# Patient Record
Sex: Female | Born: 1960 | Race: Black or African American | Hispanic: No | Marital: Single | Smoking: Never smoker
Health system: Southern US, Community
[De-identification: ages and names within clinical notes are randomized; demographics above are authoritative.]

## PROBLEM LIST (undated history)

## (undated) DIAGNOSIS — E119 Type 2 diabetes mellitus without complications: Secondary | ICD-10-CM

## (undated) DIAGNOSIS — I82409 Acute embolism and thrombosis of unspecified deep veins of unspecified lower extremity: Secondary | ICD-10-CM

## (undated) DIAGNOSIS — R413 Other amnesia: Secondary | ICD-10-CM

## (undated) DIAGNOSIS — G629 Polyneuropathy, unspecified: Secondary | ICD-10-CM

## (undated) DIAGNOSIS — E7800 Pure hypercholesterolemia, unspecified: Secondary | ICD-10-CM

## (undated) DIAGNOSIS — I10 Essential (primary) hypertension: Secondary | ICD-10-CM

## (undated) DIAGNOSIS — N289 Disorder of kidney and ureter, unspecified: Secondary | ICD-10-CM

## (undated) HISTORY — PX: TOE AMPUTATION: SHX809

## (undated) HISTORY — PX: LEG AMPUTATION: SHX1105

---

## 2010-05-05 DIAGNOSIS — E119 Type 2 diabetes mellitus without complications: Secondary | ICD-10-CM

## 2020-05-10 DIAGNOSIS — D649 Anemia, unspecified: Secondary | ICD-10-CM | POA: Diagnosis present

## 2020-05-10 DIAGNOSIS — K922 Gastrointestinal hemorrhage, unspecified: Secondary | ICD-10-CM | POA: Diagnosis present

## 2020-05-15 NOTE — Consults (Signed)
 Inpatient Consult to [facility] Consult performed by: Lyell Catena, MD Consult ordered by: Sporici, Romeo Augustin, MD      Patient: Brandi Hernandez DOB: [birth year] MRN: [id number]   Reason for Consult:  R foot infection  HPI:  I would like to thank you for allowing me to participate in the care of this 60 year old female who has not received any COVID-19 vaccination with past medical history of type 1 diabetes mellitus with diabetic peripheral neuropathy, GI bleed, chronic disease and blood loss anemia, hypertension, obesity, peripheral vascular disease, status post right lower extremity angiogram and angioplasty in the past, most recently left revascularization fem pop artery with angioplasty/stent on 02/20/2020, 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 lugdunensis and prevotella, treated with IV antibiotic in the hospital discharged off antibiotics.  This time patient presented to the emergency room on 02/19 with reports of weakness, lethargy and possibly wound infection but the left TMA site.  Upon arrival she was afebrile with a temperature of 99.8, subsequently spiked a fever on 2/22 but has been afebrile since, she has been hemodynamically stable with episodic tachycardia and and controlled hypertension and on room air.  Labs revealed glucose of 314, sodium 135, BUN was 37 with a creatinine of 2.08 down to 0.6 yesterday, LFTs were normal, lactic acid was 1.7, WBC  count was 21.35 down to 16.67, hemoglobin was 4.7, patient required transfusions and now it is up to 8.5, platelets were 467 K, urinalysis was benign, drug screen was negative, procalcitonin was 0.94, COVID-19 screen was negative on 02/19, urine culture grew less than a 1000 colonies of lactobacillus, chest x-ray on admission with no infiltrate, CT of the abdomen and pelvis on 02/19 showed no obvious source of bleeding, patient underwent EGD on 02/21 with no source of bleeding identified, colonoscopy on 02/22 showing diverticulosis with no obvious source of bleeding, polypectomies were performed with pathology showing fragments of tubular adenomas.  Due to extensive nature of infected gangrene patient underwent a guillotine left through the ankle amputation on 02/23, Gram stain showed moderate Gram-negative bacilli and Gram-positive cocci in pairs, culture pending, patient received 4 days of IV Vanco, last dose on 02/22 and cefepime, currently day # 6.  ID is consulted to comment on management.  At this time patient seems to be depressed about her health condition, reports no significant pain the amputation site and denies recurrent black stools.  She is tolerating her diet and has had no fever or chills.  Patient claims that the wound has never healed since the procedure in December, she has been following with Podiatry and has been on 2 different antibiotics prior to admission but could not remember the names.  ROS:  Constitutional:  + Intermittent fever, no chills, on and off night sweats, generalized weakness  HEENT:  No visual complaints, no dental complaints, no hearing loss, no nasal congestion, no sore throat. Respiratory:  No shortness of breath or cough, no sputum production, no pleuritic chest pain, no wheezing Cardiovascular:  No chest pain, no orthopnea, no dyspnea on exertion, no palpitations or bradycardia, no leg swelling Gastrointestinal:  Denies any nausea, vomiting, abdominal pain or  diarrhea, admits to dark colored stools prior to admission, see  above regarding workup  Genitourinary:   no dysuria, hematuria or frequency Hematology/lymphatics:  + bruising/bleeding tendency due to blood thinners, no swollen lymph nodes Endocrine:  No polydipsia, no polyuria, no heat or cold intolerance Immunology:  Not immunocompromised, +recurrent diabetic/ischemic foot infections Musculoskeletal:  See above Skin:  See above Psychiatric:  Somewhat depressed Neurologic:  Alert and oriented, reports no headaches, abnormal balance, numbness or tingling in any of the extremities  PMH/PSH:  Past Medical History:  Diagnosis Date  . Diabetes mellitus type 1 (CMS-HCC)   . DJD (degenerative joint disease) 05/05/2010  . DM (diabetes mellitus) (CMS-HCC) 05/05/2010  . Hypertension   . Obesity 05/05/2010  . Peripheral vascular disease (CMS-HCC)     Past Surgical History:  Procedure Laterality Date  . AMPUTATION FOOT,TRANSMETATARSAL Right 06/09/2018   Procedure: AMPUTATION FOOT TRANSMETARSAL;  Surgeon: Devora Ards, DPM;  Location: [facility] OR;  Service: PODIATRY  . AMPUTATION FOOT,TRANSMETATARSAL Left 03/06/2020   Procedure: AMPUTATION FOOT TRANSMETARSAL;  Surgeon: Delayne Reche HERO., DPM;  Location: [facility] OR;  Service: PODIATRY  . AMPUTATION METATARSAL+TOE,SINGLE Left 02/29/2020   Procedure: AMPUTATION METATARSAL W/ TOE SINGLE;  Surgeon: Devora Ards, DPM;  Location: [facility] OR;  Service: PODIATRY  . DEEP DISSEC FOOT INFEC,1 BURSA Left 02/29/2020   Procedure: INCISION AND DRAINAGE BELOW FASCIA FOOT 1 BURSAL SPACE;  Surgeon: Devora Ards, DPM;  Location: [facility] OR;  Service: PODIATRY  . DEEP DISSEC FOOT INFEC,MULTIPLE Right 06/05/2018   Procedure: INCISION AND DRAINAGE BELOW FASCIA FOOT MULTIPLE AREAS;  Surgeon: Devora Ards, DPM;  Location: [facility] OR;  Service: PODIATRY  . DEEP INCIS FOOT BONE INFECTN Right 06/14/2018   Procedure: INCISION BONE CORTEX FOOT;  Surgeon: Devora Ards, DPM;   Location: [facility] OR;  Service: PODIATRY  . ESOPHAGOGASTRODUODENOSCOPY TRANSORAL DIAGNOSTIC N/A 05/12/2020   Procedure: EGD FLEXIBLE TRANSORAL DIAGNOSTIC W/WO SPECIMEN COLLECTION BY BRUSHING/WASHING;  Surgeon: Geralene Lea HERO., DO;  Location: [facility] OR;  Service: GI  . LAPAROSCOPIC CHOLECYSTECTOMY N/A 12/31/2019   Procedure: LAPAROSCOPY CHOLECYSTECTOMY;  Surgeon: Reginal Sharlet DASEN, MD;  Location: [facility] OR;  Service: GENERAL  . PR COLONOSCOPY W/BIOPSY SINGLE/MULTIPLE N/A 11/19/2019   Procedure: COLONOSCOPY FLEXIBLE W/ BIOPSY;  Surgeon: Reginal Sharlet DASEN, MD;  Location: [facility] OR;  Service: GENERAL  . PR COLSC FLX W/RMVL OF TUMOR POLYP LESION SNARE TQ N/A 05/13/2020   Procedure: COLONOSCOPY FLEXIBLE W/ REMOVAL LESION BY SNARE TECHNIQUE;  Surgeon: Geralene Lea HERO., DO;  Location: [facility] OR;  Service: GI  . PR DEBRIDEMENT, SKIN, SUB-Q TISSUE,MUSCLE,=<20 SQ CM Left 03/04/2020   Procedure: DEBRIDEMENT MUSCLE AND FASCIA 20 SQ CM/<;  Surgeon: Delayne Reche HERO., DPM;  Location: [facility] OR;  Service: PODIATRY  . PR NEGATIVE PRESSURE WOUND THERAPY DME </= 50 SQ CM Left 03/04/2020   Procedure: NEGATIVE PRESSURE WOUND THERAPY </= 50 SQ CM;  Surgeon: Delayne Reche HERO., DPM;  Location: [facility] OR;  Service: PODIATRY  . PR REVASCULARIZE FEM/POP ARTERY,ANGIOPLASTY N/A 02/20/2020   Procedure: VASC REVASCULARIZATION FEM/POPLITEAL ARTERY W/ ANGIOPLASTY;  Surgeon: Dyane Toribio PARAS, MD;  Location: [facility] OR;  Service: VASCSURG  . PR REVASCULARIZE FEM/POP ARTERY,ANGIOPLASTY/STENT N/A 02/20/2020   Procedure: VASC REVASCULARIZATION FEM/POPLITEAL ARTERY W/ STENT AND ANGIOPLASTY;  Surgeon: Dyane Toribio PARAS, MD;  Location: [facility] OR;  Service: VASCSURG  . PR REVASCULARIZE FEM/POP ARTERY,ANGIOPLASTY/STENT/ATHERECTOMY N/A 02/20/2020   Procedure: VASC REVASCULARIZATION FEM/POPLITEAL ARTERY W/ STENT, ATHERECTOMY, AND ANGIOPLASTY;  Surgeon: Dyane Toribio PARAS, MD;  Location: [facility] OR;  Service: VASCSURG  . PR REVASCULARIZE TIBIAL/PERON ARTERY,ANGIOPLASTY/ATHERECTOMY  INITIAL  06/08/2018   Procedure:  CATH REVASCULARIZATION TIB/PERONEAL ARTERY W/ ATHERECTOMY AND ANGIOPLASTY; 1ST VESSEL;  Surgeon: Dyane Toribio PARAS, MD;  Location: [facility] HEART VASCULAR INVASIVE;  Service: VASCSURG  . PR SLCTV CATHJ 2ND ORDER ABDL PEL/LXTR ART [facility] N/A 02/20/2020   Procedure: VASC SELECTIVE CATHETERIZATION 2ND ORDER ABDOMINAL PELVIC/LOWER EXTREMITY ART BRANCH;  Surgeon: Dyane Toribio PARAS, MD;  Location: [facility] OR;  Service: VASCSURG  . PR THROMBOLYSIS ARTERIAL INFUSION ICRA RS&I INIT TX N/A 06/08/2018   Procedure: VASC TRANSCATHETER THERAPY, ARTERIAL INFUSION FOR THROMBOLYSIS OTHER THAN CORONARY OR INTRACRANIAL, INITIAL TREATMENT DAY;  Surgeon: Dyane Toribio PARAS, MD;  Location: [facility] HEART VASCULAR INVASIVE;  Service: VASCSURG  . SECD CLOS SURG WND EXTEN/COMPLIC Right 06/14/2018   Procedure: SECONDARY CLOSURE SURG WOUND/DEHISCENCE EXTENSIVE/COMPLEX;  Surgeon: Devora Ards, DPM;  Location: [facility] OR;  Service: PODIATRY  . US  GUIDE, VASC ACCESS, REQ EVAL, POTENTIAL SITES, VESSEL PATENCY, NEEDLE ENTRY, W/RECORD & REPORT  02/20/2020   Procedure: VASC US  VASC ACCESS SITS VSL PATENCY NDL ENTRY;  Surgeon: Dyane Toribio PARAS, MD;  Location: [facility] OR;  Service: VASCSURG     MEDS:  . cefEPIME  2 g intraVENOUS Q12H  . chlorhexidine gluconate  1 application topical Daily  . insulin aspart  0-6 Units subcutaneous With meals & bedtime  . lansoprazole  30 mg oral Daily pre breakfast    Prior to Admission medications   Medication Sig Start Date End Date Taking? Authorizing Provider  amLODIPine  5 mg tablet Take 5 mg by mouth daily. Indications: High Blood Pressure Disorder   Yes Provider, Outside  aspirin  EC 81 MG EC tablet Take 1 tablet by mouth daily. 02/21/20  Yes Ciccarelli, Delon BROCKS., CRNP  atorvaSTATin  40 mg tablet Take 1 tablet by mouth daily at bedtime. 02/20/20  Yes Ciccarelli, Delon BROCKS., CRNP  clopidogrel 75 MG tablet Take 75 mg by mouth daily.   Yes Provider, Outside  Glucose Blood (ONE TOUCH ULTRA  TEST) In Vitro STRP Test daily Dx E11.9 06/23/15  Yes Leola Tanda LABOR, MD  Insulin NPH Isophane & Regular (NOVOLIN  70/30 FLEXPEN RELION) (70-30) 100 UNIT/ML Egan SUPN 20 units in prebreakfast and 15 units predinner Patient taking differently: Inject  under the skin 2 times a day with meals. Inject 20 units daily before breakfast and 15 units before dinner 06/16/18  Yes Vasiliadis, Maria, DO  Insulin Pen Needle (RELION MINI PEN NEEDLES) 31G X 6 MM XX MISC Twice daily 06/16/18  Yes Vasiliadis, Maria, DO  lansoprazole 30 mg DR capsule Take 1 capsule by mouth daily before breakfast. 02/21/20   Ciccarelli, Delon BROCKS., CRNP  lisinopril  40 MG tablet Take 40 mg by mouth daily. Indications: High Blood Pressure Disorder 06/09/18  Yes Provider, Outside  meloxicam 15 MG tablet Take 1 tablet by mouth daily as needed (pain). 11/07/19  Yes Provider, Outside  ONE TOUCH ULTRASOFT LANCETS XX MISC check blood glucose 1-2  times a day/ PRN 01/11/13  Yes Leola Tanda LABOR, MD  Santyl 250 UNIT/GM External Ointment Apply 1 application to affected area every other day. To left foot. 04/22/20  Yes Provider, Outside  sodium hypochlorite 1/2 strength 0.25 % SOLN topical solution Apply 1 application to affected area every other day. To left foot. 04/15/20  Yes Provider, Outside     ALLERGIES:  No Known Allergies   FH:  Family History  Problem Relation Name Age of Onset  . CAD Sister    . Hypertension Sister    . Diabetes Mother    . Prostate Cancer Father prostate  SH:  Social History   Tobacco Use  . Smoking status: Never Smoker  . Smokeless tobacco: Never Used  Substance Use Topics  . Alcohol use: Not Currently    Comment: none  . Drug use: No     Vitals:  Temp (24hrs), Avg:98.2 F (36.8 C), Min:97.4 F (36.3 C), Max:99.8 F (37.7 C)   BP 161/80  Pulse 80  Temp (Src) 97.9 F (36.6 C) (Oral)  Resp 18  Ht 5' 7 (1.702 m)  Wt 202 lb 6.4 oz (91.8 kg)  SpO2 99%  BMI 31.7 kg/m2  Breastfeeding? No    Physical Exam:  General:  Chronically ill-appearing somewhat obese female No acute distress HEENT: PERRL, EOMI, dentition is fair with no oral lesions Neck: Supple, trachea midline, no lymphadenopathy, no JVDs Lungs: Clear to auscultation bilaterally CV: Regular rate and rhythm, S1 and S2 normal, no murmur, rub, or gallop GI: Soft, nondistended, non-tender, bowel sounds normoactive no organomegaly or masses GU:  Normal appearing genitalia.  + foley catheter in place, inserted 2/19 Back:  No CVA or flank tenderness, no spine tenderness Extremities   bulky surgical dressing over the left TAA, well-healed right TMA scar, + peripheral IV present with no signs of infection Skin:  No rashes Psych:  Somewhat depressed but cooperative with intact cognition Neuro:  Alert and oriented x 4, no obvious focal motor deficits, cranial nerves 2-12 grossly intact   Media Information   Document Information  [employer] Clinical Image  Let dorsal foot pre amputation  05/11/2020 11:00  Attached To:  Hospital Encounter on 05/10/20 <redacted file path>  Source Information  Lenore Odor, RN  [facility]   Media Information   Document Information  [employer] Clinical Image  Left lateral foot   05/11/2020 11:00  Attached To:  Hospital Encounter on 05/10/20 <redacted file path>  Source Information  Lenore Odor, RN  [facility]    Labs: Ambulatory Lab values    Lab Details Latest Ref Rng & Units 05/14/2020 05/14/2020 05/14/2020   RBC 3.70 - 5.20 M/uL 3.15(L) 3.35(L) 3.19(L)   WBC 3.50 - 10.00 K/uL 16.28(H) 16.53(H) 16.67(H)   Hemoglobin 11.7 - 15.7 g/dL 1.5(O) 8.9(L) 8.5(L)   Hematocrit 34.0 - 47.0 % 25.5(L) 27.3(L) 25.7(L)   PLT 150 - 400 K/uL 388 405(H) 407(H)        Results from last 7 days  Lab Units 05/14/20 0537 05/13/20 0547 05/12/20 0530  SODIUM mmol/L 140 142 140  POTASSIUM mmol/L 3.6 3.6 4.0  CARBON DIOXIDE mmol/L  21 22 20*  UREA NITROGEN mg/dL 5* 6* 10  CREATININE mg/dL 9.39 9.38 9.30  GLUCOSE mg/dL 90 865* 850*  CALCIUM  mg/dL 7.7* 7.4* 7.4*    Results from last 7 days  Lab Units 05/12/20 0530 05/10/20 1236  ALKALINE PHOSPHATASE IU/L 76 87  ALBUMIN g/dL 2.2*  --   ALT IU/L 20 27  AST IU/L 22 46*    Procalcitonin result last 7 days      05/10/20                  1236          PCT 0.94               02/28/2020: LACTATE 1.8 05/10/2020: LACTATE 1.7  Urinalysis Reflex Culture  Results for orders placed or performed during the hospital encounter of 10/22/19  URINALYSIS REFLEX CULTURE   Specimen: URINE  Result Value Ref Range  SP. GRAVITY 1.012 1.003 - 1.035   GLUCOSE 3+ (A) NEGATIVE   KETONE NEGATIVE NEGATIVE   BLOOD NEGATIVE NEGATIVE   PH 5.0 5.0 - 8.0   PROTEIN 2+ (A) NEGATIVE   UROBILINOGEN <2.0 <2.0 mg/dL   NITRITE NEGATIVE NEGATIVE   LEUK ESTERASE 1+ (A) NEGATIVE   BACTERIA NEGATIVE NEGATIVE   SQUAMOUS EPITHELIAL CELLS RARE <3 /LPF   HYALINE CASTS >10 /LPF   RBC'S 0-2 0 - 3 /HPF   WBC'S 3-5 0 - 5 /HPF     Urine Culture: No results found for this or any previous visit.  Blood Cultures Results for orders placed or performed during the hospital encounter of 05/10/20  BLOOD CULTURE   Specimen: BLOOD  Result Value Ref Range   CULTURE      NO GROWTH NO ADDITIONAL NEGATIVE RESULTS WILL BE POSTED UNTIL THE FINAL REPORT     Radiology:  No results found.   Assessment and Plan:  Impression:  1. Infected gangrenous left TMA stump, procedure initially done on 03/06/2020 and at that time cultures grew Citrobacter, PSE/VSE, methicillin-resistant S. lugdunensis and prevotella, status post guillotine through ankle amputation on 02/23 with finding of gross purulence, or cultures pending but Gram stain is suggestive of polymicrobial infection, status post 4 days of IV Vanco, last dose on 02/22, currently on day # 6 IV cefepime. 2. History of significant peripheral  vascular disease, status post multiple procedures, most recently on 12/01 with angiogram, vascular revascularization fem/popliteal artery with stents, angioplasty and atherectomy. 3. Leukocytosis, multifactorial likely due to above as well as reactive to significant anemia, does not seem to be resolving possibly due to persistent source of infection in her foot, possibly uncovered pathogens such as anaerobes. 4. History of type 1 diabetes mellitus with diabetic peripheral neuropathy, poorly controlled 5. Significant anemia likely due to chronic disease but most recently blood loss anemia, status post EGD and colonoscopy on 2/21 and 2/22 respectively with no identifiable source of bleed, status post polypectomies with pathology showing tubular adenomas 6. History of hypertension, poorly controlled  7. Acute renal failure, present on admission, likely due to dehydration and sepsis, already resolved   Recommendations:  1. Discontinue IV cefepime 2. Start IV Zosyn pending OR cultures, clearly aggressive antibiotic management will be pursued to avoid BKA as per patient's request, discharge antibiotic route duration will depend on culture and wound healing.  I plan to follow the patient closely post discharge along with Podiatry. 3. Await OR cultures 4. Probiotics   Case discussed with primary care team ID will follow, thank you for this consult

## 2020-05-15 NOTE — Progress Notes (Signed)
 ------------------------------------------------------------------------------- Attestation signed by Loretta Hamilton, MD at 05/15/2020  4:33 PM Patient seen and examined.  I had a long discussion with the family and with the patient as to the plan.  We have performed a guillotine amputation as yesterday I discovered that the area of muscle destruction and gross purulence was greater than expected.  In these situations a two-stage operation has been shown to be better than single-stage amputation.  Additionally, I want to make sure that the muscle viability maintains itself over the next several days so that we can assure the patient that her below-knee amputation has the best chance of healing.  We will continue observation antibiotics and dressing changes. -------------------------------------------------------------------------------  Vascular Surgery Soap Note:  (LOS: 5 days)   Subjective     Objective     POD #1 Guillotine through ankle amputation   BP 148/71  Pulse 88  Temp (Src) 97.7 F (36.5 C) (Oral)  Resp 17  Ht 5' 7 (1.702 m)  Wt 202 lb 6.4 oz (91.8 kg)  SpO2 100%  BMI 31.7 kg/m2  Breastfeeding? No  Intake/Output Summary (Last 24 hours) at 05/15/2020 1114 Last data filed at 05/15/2020 0730 Gross per 24 hour  Intake 1290.44 ml  Output 1795 ml  Net -504.56 ml    Physical Exam HENT:     Mouth/Throat:     Mouth: Mucous membranes are moist.  Eyes:     Extraocular Movements: Extraocular movements intact.     Pupils: Pupils are equal, round, and reactive to light.  Cardiovascular:     Rate and Rhythm: Normal rate and regular rhythm.  Pulmonary:     Effort: Pulmonary effort is normal.     Breath sounds: Normal breath sounds. No wheezing, rhonchi or rales.  Musculoskeletal:     Cervical back: Normal range of motion and neck supple.  Skin:    Comments: Left ankle amputation with ACE  Neurological:     Comments: Oriented x 3, but some trouble understanding and/or  digesting complexity of hospitalization and plan of care.     Labs:  Results from last 7 days  Lab Units 05/15/20 0708  WBC K/uL 15.40*  HEMOGLOBIN g/dL 8.5*  HEMATOCRIT % 74.2*  PLATELETS K/uL 405*   Results from last 7 days  Lab Units 05/15/20 0708  SODIUM mmol/L 137  POTASSIUM mmol/L 4.1  CARBON DIOXIDE mmol/L 24  UREA NITROGEN mg/dL 17  CREATININE mg/dL 9.18  GLUCOSE mg/dL 656*  CALCIUM  mg/dL 7.3*   Results from last 7 days  Lab Units 05/10/20 1236  INR  1.49   Data Review:  Last Telemetry Reading  Cardiac Rhythm Cardiac Rhythm: sinus rhythm Telemetry/Cardiac Monitor QTC: 435   XR CHEST 1 VIEW  Result Date: 05/10/2020 CLINICAL INFORMATION: Sepsis. PROCEDURE: Single AP view of the chest. COMPARISON: 06/05/2018  FINDINGS: Support tubes, lines, devices, surgical material: None. Lungs and Pleura: Clear lungs. No pleural effusion. No pneumothorax. Cardiovascular and Mediastinum: The cardiac silhouette is within limits of normal. Mediastinal contour is within limits of normal. Skeleton, other: No acute osseous abnormalities.   No active disease in the chest.   CT ABDOMEN/PELVIS WO IV CONTRAST  Result Date: 05/10/2020 CT OF THE ABDOMEN AND PELVIS WITHOUT CONTRAST CLINICAL INFORMATION: Anemia, melena PROCEDURE: CT examination of the abdomen and pelvis was performed without intravenous contrast. The lack of intravenous contrast limits the evaluation of the solid organs. Enteric contrast was not administered. This limits the ability to identify a possible gastrointestinal tract mass which might be  responsible for melena and anemia. COMPARISON: None FINDINGS: LOWER THORAX: The lung bases are clear. The heart is normal in size. The blood pool is hypodense as compared to the myocardium, which is consistent with anemia. LIVER: The right lobe measures more than 23 cm craniocaudal due to the presence of a Riedel's lobe, a normal variant. Normal parenchymal density. No suggestion of  focal masses in this limited noncontrast study.      BILE DUCTS: No intrahepatic or extrahepatic bile duct dilation. GALLBLADDER: Cholecystectomy. PANCREAS: Within limits of an unenhanced exam, no focal pancreatic mass or calcification is identified. SPLEEN: Normal size. No suggestion of focal lesion. ADRENAL GLANDS: Within normal limits. KIDNEYS/URETERS: No hydroureteronephrosis. No radiopaque urinary tract calculi. No cysts or contour deforming masses. No perinephric stranding. BLADDER: Decompressed by a Foley catheter. The catheter tip extends into the orifice of the right ureter at the UVJ resulting in focal dilatation and wall thickening of the distal ureter (series 5/image 56). REPRODUCTIVE ORGANS: There is a slightly exophytic, 3.4 cm subserosal fibroid in the left uterine fundus. No adnexal masses seen. 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 No bowel dilatation. No adjacent inflammatory change. Mild constipation is noted in the distal colon. PERITONEUM/RETROPERITONEUM: No fluid collection, ascites, or pneumoperitoneum. LYMPH NODES: No abdominal or pelvic lymphadenopathy, by imaging size criteria. VESSELS: Limited assessment without contrast. No abdominal aortic aneurysm [[id number]]. ABDOMINAL/PELVIC WALL: Within limits of normal. BONES: No aggressive osseous lesion.   1. Limited study without oral or intravenous contrast. No acute findings. 2. Hypodense blood pool consistent with anemia. 3. Partial obstruction of the right UVJ by the tip of the Foley catheter. This may be transient as the bladder is decompressed. 4.Incidental gastric diverticulum and a subserosal uterine fibroid.    Assessment & Plan  Principal Problem:   Sepsis (CMS-HCC) Active Problems:   DM (insulin)    AKI (acute kidney injury) (CMS-HCC)   Peripheral vascular disease (CMS-HCC)   HTN (hypertension)   GI bleed   Anemia   Melena   Non-healing wound of lower extremity   Assessment/  Plan:   Gangrene left lower extremity - POD # 1 guillotine through ankle amputation  - gross purulence in the entirety of the forefoot with non viability of musculature extending approximately 6 cm into the anterior compartment.  Purulent matter extending 4 cm and anterior compartment.  Clean non grossly contaminated musculature in the posterior compartment and the remainder of the anterior and lateral compartments after debridement.  - continue IV antibiotics in order to attempt to clear infection  - will revise to either an above the knee or below the knee amputation next week.  - appreciate ID input - family to be updated again by Dr. Loretta today      Lauren D. Waronker, NP

## 2020-05-23 NOTE — H&P (Signed)
 Burgess Divine, MD  05/23/2020  9:24 PM   PCP:     Comer [name]  Extended Emergency Contact Information Primary Emergency Contact: Christin, [place] Phone: [phone] Relation: Niece/Nephew Secondary Emergency Contact: Simpson,Carol Address: [address] [place], [place] [address] Mobile Phone: [phone] Relation: Sibling  Chief Complaint:    Elevated blood pressure    ==============================================================  Assessment/Plan:  Principal Problem:   HTN (hypertension) Active Problems:   DM (insulin)    Acute osteomyelitis of left ankle or foot (CMS-HCC)   Anemia   Tachycardia   S/P BKA (below knee amputation) unilateral, left (CMS-HCC)  Hypertensive urgency  - patient asymptomatic, BP 183/90 in ER -reviewed previous hospitalization, blood pressure running 150-170 systolic -increase Norvasc  to 10 mg daily, continue lisinopril  40 mg -p.r.n. 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  Diabetes mellitus type 2 with neuropathy - continue NovoLog 4 units t.i.d. with meals - continue Lantus 12 units at bedtime - sliding scale insulin  Peripheral arterial disease status post multiple revascularization - continue statin, aspirin    Chronic anemia/hx lower GI bleed - hemoglobin was 8.2 on discharge -patient underwent colonoscopy on previous hospitalization and required 5 units PRBC   CODE STATUS:   Full code VTE prophylaxis:   SubQ Lovenox Disposition:  Likely patient can be discharged to [facility] once BP better controlled  Problems and plans were discussed and explained.  These persons indicate understanding of POC and are in agreement.   [x]  patient    [x]  nurses []  family   []  clinical pharmacist      []  consultants  ==============================================================  History of Present  Illness:  Brandi Hernandez is a 60 y.o. 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  The EMT crew called ER as patient found to have blood pressure 190/88 and elevated heart rate and wanted to return patient to the room.  [facility] was involved and plan was to hospitalize the patient via ER. Patient denies any chest pain, shortness of breath, headache, new weakness numbness, extremity pain.  Reviewed labs from 03/04 and patient had COVID 19 negative PCR.  History obtained from:  [x]  patient   []  outpatient clinicians  []  family   []  staff at facility (SNF, [place]) []  other     [x]  review of medical record as summarized below  Pt unable to provide reliable history because: []  Acute mental status change []  Acute illness/medical condition []  chronic dementia/mental challenges []  poor insight []  other  ____   ED Medications:  No orders of the defined types were placed in this encounter.    Review of Systems:  Review of Systems  Constitutional: Negative for chills, fever, malaise/fatigue and weight loss.  HENT: Negative for hearing loss and tinnitus.   Eyes: Negative for blurred vision, double vision and photophobia.  Respiratory: Negative for cough, hemoptysis, sputum production and shortness of breath.   Cardiovascular: Negative for chest pain, palpitations and orthopnea.  Gastrointestinal: Negative for abdominal pain, heartburn, nausea and vomiting.  Genitourinary: Negative  for dysuria, frequency and urgency.  Skin: Negative for rash.  Neurological: Negative for dizziness, tingling, tremors, sensory change, speech change, focal weakness and headaches.   Psychiatric/Behavioral: Negative for substance abuse and suicidal ideas.   Reviewed and inquired about the all system directly related to the patient's problem along with reviewing all other systems which are Non Contributory other than noted  Allergies:    Allergy List: No Known Allergies.  Home Medications:  Prior to Admission medications   Medication Sig Start Date End Date Taking? Authorizing Provider  amLODIPine  5 mg tablet Take 5 mg by mouth daily. Indications: High Blood Pressure Disorder    Provider, Outside  aspirin  EC 81 MG EC tablet Take 1 tablet by mouth daily. 02/21/20   Ciccarelli, Delon BROCKS., CRNP  atorvaSTATin  40 mg tablet Take 1 tablet by mouth daily at bedtime. 02/20/20   Ciccarelli, Delon C., CRNP  Glucose Blood (ONE TOUCH ULTRA TEST) In Vitro STRP Test daily Dx E11.9 06/23/15   Leola Tanda LABOR, MD  insulin aspart 100 UNIT/ML PEN injection Inject 4 Units under the skin 3 times a day with meals. 05/23/20   Sporici, Romeo Augustin, MD  insulin glargine 100 UNIT/ML PEN injection Inject 12 Units under the skin daily at bedtime. 05/23/20   Sporici, Romeo Augustin, MD  lansoprazole 30 mg DR capsule Take 1 capsule by mouth daily before breakfast. 02/21/20   Ciccarelli, Delon BROCKS., CRNP  lisinopril  40 MG tablet Take 40 mg by mouth daily. Indications: High Blood Pressure Disorder 06/09/18   Provider, Outside  meloxicam 15 MG tablet Take 1 tablet by mouth daily as needed (pain). 11/07/19   Provider, Outside  oxyCODONE-acetaminophen  5-325 MG per tablet Take 1 tablet by mouth every 6 hours as needed (If pain not relieved by acetaminophen ). 05/23/20   Sporici, Romeo Augustin, MD  Santyl 250 UNIT/GM External Ointment Apply 1 application to affected area every other day. To left foot. 04/22/20   Provider, Outside  sodium hypochlorite 1/2 strength 0.25 % SOLN topical solution Apply 1 application to affected area every other day. To left foot. 04/15/20   Provider, Outside     Past Medical  History:   Past Medical History: 1) Dm (diabetes mellitus) (cms-hcc) (05/05/2010) 2) Djd (degenerative joint disease) (05/05/2010) 3) Obesity (05/05/2010) 4) Hypertension 5) Peripheral vascular disease (cms-hcc) 6) Diabetes mellitus type 1 (cms-hcc) all reviewed, Non Contributory other than Noted  Past Surgical History:   Past Surgical History: 1) Deep dissec foot infec,multiple (06/05/2018) 2) Pr revascularize tibial/peron artery,angioplasty/atherectomy initial (06/08/2018) 3) Pr thrombolysis arterial infusion icra rs&i init tx (06/08/2018) 4) Amputation foot,transmetatarsal (06/09/2018) 5) Secd clos surg wnd exten/complic (06/14/2018) 6) Deep incis foot bone infectn (06/14/2018) 7) Pr colonoscopy w/biopsy single/multiple (11/19/2019) 8) Laparoscopic cholecystectomy (12/31/2019) 9) Pr slctv cathj 2nd order abdl pel/lxtr art brnch (02/20/2020) 10) Pr revascularize fem/pop artery,angioplasty (02/20/2020) 11) Pr revascularize fem/pop artery,angioplasty/stent (02/20/2020) 12) Pr revascularize fem/pop artery,angioplasty/stent/atherectomy (02/20/2020) 13) Us  guide, vasc access, req eval, potential sites, vessel patency, needle entry, w/record & report (02/20/2020) 14) Amputation metatarsal+toe,single (02/29/2020) 15) Deep dissec foot infec,1 bursa (02/29/2020) 16) Pr debridement, skin, sub-q tissue,muscle,=<20 sq cm (03/04/2020) 17) Pr negative pressure wound therapy dme </= 50 sq cm (03/04/2020) 18) Amputation foot,transmetatarsal (03/06/2020) 19) Esophagogastroduodenoscopy transoral diagnostic (05/12/2020) 20) Pr colsc flx w/rmvl of tumor polyp lesion snare tq (05/13/2020) 21) Amputation low leg thru tib/fib (05/14/2020) 22) Amputation low leg thru tib/fib (05/21/2020) all reviewed, Non Contributory other than Noted  Social History:   Social History:  Marital Status: Single                 Number of children:              Occupation:                                                 Tobacco Use:  Never          Alcohol Use: Not Current*      Comment: none   Drug Use:    No             Sexually Active: Yes             Partners with: Female   all reviewed, Non Contributory other than Noted  Family History:  Family History  Problem Relation Name Age of Onset  . CAD Sister    . Hypertension Sister    . Diabetes Mother    . Prostate Cancer Father prostate      Physical Exam:  Vitals:   05/23/20 2047 05/23/20 2053  BP: (!) 183/90   BP Location: right upper arm   Patient Position: Lying   Pulse: 92   Resp: 16   Temp: 98.4 F (36.9 C)   TempSrc: Oral   SpO2: 100% 100%  Weight: 195 lb (88.5 kg)   Height: 5' 7 (1.702 m)     Physical Exam Constitutional:      General: She is not in acute distress.    Appearance: She is not ill-appearing or diaphoretic.  HENT:     Head: Normocephalic and atraumatic.  Eyes:     General: No scleral icterus.       Right eye: No discharge.        Left eye: No discharge.     Conjunctiva/sclera: Conjunctivae normal.     Pupils: Pupils are equal, round, and reactive to light.  Neck:     Vascular: No JVD.     Trachea: No tracheal deviation.  Cardiovascular:     Rate and Rhythm: Normal rate and regular rhythm.     Heart sounds: Normal heart sounds. No friction rub. No gallop.   Pulmonary:     Effort: No respiratory distress.     Breath sounds: Normal breath sounds. No stridor. No wheezing or rales.  Abdominal:     General: Bowel sounds are normal. There is no distension.     Palpations: Abdomen is soft.     Tenderness: There is no abdominal tenderness. There is no guarding or rebound.  Musculoskeletal:        General: No tenderness. Normal range of motion.     Cervical back: Normal range of motion and neck supple.     Comments: Left BKA with bandage covering the wound  Skin:    General: Skin is warm and dry.     Findings: No erythema or rash.  Neurological:     General: No focal deficit present.     Mental Status: She is alert  and oriented to person, place, and time.     Cranial Nerves: No cranial nerve deficit.  Psychiatric:        Mood and Affect: Affect normal.        Judgment: Judgment normal.     Labs:  No results found for this visit on 05/23/20.  Data Review:  Data/Lab Review: yes   Radiology: No results found.

## 2020-10-01 ENCOUNTER — Other Ambulatory Visit: Payer: Self-pay | Admitting: Internal Medicine

## 2020-10-01 DIAGNOSIS — R413 Other amnesia: Secondary | ICD-10-CM

## 2020-10-02 ENCOUNTER — Other Ambulatory Visit: Payer: Self-pay | Admitting: Internal Medicine

## 2020-10-02 DIAGNOSIS — Z1231 Encounter for screening mammogram for malignant neoplasm of breast: Secondary | ICD-10-CM

## 2020-10-06 ENCOUNTER — Other Ambulatory Visit: Payer: Self-pay

## 2020-10-06 ENCOUNTER — Ambulatory Visit
Admission: RE | Admit: 2020-10-06 | Discharge: 2020-10-06 | Disposition: A | Discharge status: Home / Self Care | Payer: 59 | Source: Ambulatory Visit | Attending: Internal Medicine | Admitting: Internal Medicine

## 2020-10-06 DIAGNOSIS — Z1231 Encounter for screening mammogram for malignant neoplasm of breast: Secondary | ICD-10-CM

## 2020-10-19 ENCOUNTER — Encounter (HOSPITAL_BASED_OUTPATIENT_CLINIC_OR_DEPARTMENT_OTHER): Payer: Self-pay

## 2020-10-19 ENCOUNTER — Emergency Department (HOSPITAL_BASED_OUTPATIENT_CLINIC_OR_DEPARTMENT_OTHER): Payer: 59

## 2020-10-19 ENCOUNTER — Emergency Department (HOSPITAL_BASED_OUTPATIENT_CLINIC_OR_DEPARTMENT_OTHER)
Admission: EM | Admit: 2020-10-19 | Discharge: 2020-10-19 | Disposition: A | Discharge status: Home / Self Care | Payer: 59 | Attending: Emergency Medicine | Admitting: Emergency Medicine

## 2020-10-19 ENCOUNTER — Encounter: Payer: Self-pay | Admitting: Emergency Medicine

## 2020-10-19 DIAGNOSIS — M79661 Pain in right lower leg: Secondary | ICD-10-CM

## 2020-10-19 DIAGNOSIS — E1140 Type 2 diabetes mellitus with diabetic neuropathy, unspecified: Secondary | ICD-10-CM

## 2020-10-19 DIAGNOSIS — R413 Other amnesia: Secondary | ICD-10-CM | POA: Insufficient documentation

## 2020-10-19 DIAGNOSIS — E7800 Pure hypercholesterolemia, unspecified: Secondary | ICD-10-CM | POA: Insufficient documentation

## 2020-10-19 DIAGNOSIS — I82409 Acute embolism and thrombosis of unspecified deep veins of unspecified lower extremity: Secondary | ICD-10-CM | POA: Insufficient documentation

## 2020-10-19 DIAGNOSIS — G629 Polyneuropathy, unspecified: Secondary | ICD-10-CM | POA: Insufficient documentation

## 2020-10-19 DIAGNOSIS — I10 Essential (primary) hypertension: Secondary | ICD-10-CM | POA: Diagnosis not present

## 2020-10-19 HISTORY — DX: Essential (primary) hypertension: I10

## 2020-10-19 HISTORY — DX: Pure hypercholesterolemia, unspecified: E78.00

## 2020-10-19 HISTORY — DX: Polyneuropathy, unspecified: G62.9

## 2020-10-19 HISTORY — DX: Type 2 diabetes mellitus without complications: E11.9

## 2020-10-19 HISTORY — DX: Other amnesia: R41.3

## 2020-10-19 HISTORY — DX: Disorder of kidney and ureter, unspecified: N28.9

## 2020-10-19 HISTORY — DX: Acute embolism and thrombosis of unspecified deep veins of unspecified lower extremity: I82.409

## 2020-10-19 MED ORDER — ACETAMINOPHEN 500 MG PO TABS
1000.0000 mg | ORAL_TABLET | Freq: Once | ORAL | Status: AC
  Administered 2020-10-19: 1000 mg via ORAL
  Filled 2020-10-19: qty 2

## 2020-10-19 NOTE — Discharge Instructions (Addendum)
It was our pleasure to provide your ER care today - we hope that you feel better.  Your imaging study shows no blood clot.   Take acetaminophen as need for pain.   Follow up with primary care doctor in 1-2 weeks.   Return to ER if worse, new symptoms, increased swelling, redness, fevers, or other concern.

## 2020-10-19 NOTE — ED Provider Notes (Signed)
[facility] EMERGENCY DEPT Provider Note   CSN: [id number] Arrival date & time: 10/19/20  2458     History Chief Complaint  Patient presents with   Leg Pain    Brandi Hernandez is a 60 y.o. female.  Pt c/o right lower leg pain for past few days. Symptoms acute onset, moderate, constant, persistent. Occurs at rest. Is not ambulatory at baseline due to prior partial right foot amputation and left BKA. ?hx dvt. No anticoagulation therapy. Denies leg swelling or redness. No fever or chills. No numbness. ?hx diabetic neuropathy. No cp or sob.   The history is provided by the patient and a relative.  Leg Pain Associated symptoms: no back pain and no fever       Past Medical History:  Diagnosis Date   Deep vein thrombosis (DVT) (HCC)    Diabetes mellitus without complication (HCC)    Hypercholesterolemia    Hypertension    Memory deficit    Peripheral neuropathy    Renal disorder     Patient Active Problem List   Diagnosis Date Noted   Peripheral neuropathy 10/19/2020   Hypercholesterolemia 10/19/2020   Memory deficit 10/19/2020   Deep vein thrombosis (DVT) (HCC) 10/19/2020    Past Surgical History:  Procedure Laterality Date   LEG AMPUTATION Left    Below the knee   TOE AMPUTATION Right      OB History   No obstetric history on file.     History reviewed. No pertinent family history.  Social History   Tobacco Use   Smoking status: Never   Smokeless tobacco: Never  Vaping Use   Vaping Use: Never used    Home Medications Prior to Admission medications   Not on File    Allergies    Patient has no known allergies.  Review of Systems   Review of Systems  Constitutional:  Negative for chills and fever.  HENT:  Negative for sore throat.   Eyes:  Negative for redness.  Respiratory:  Negative for shortness of breath.   Cardiovascular:  Negative for chest pain.  Gastrointestinal:  Negative for abdominal pain.  Genitourinary:  Negative for  flank pain.  Musculoskeletal:  Negative for back pain.  Skin:  Negative for rash.  Neurological:  Negative for numbness and headaches.  Hematological:  Does not bruise/bleed easily.  Psychiatric/Behavioral:  Negative for confusion.    Physical Exam Updated Vital Signs BP (!) 155/68 (BP Location: Right Arm)   Pulse 61   Temp 98.4 F (36.9 C) (Oral)   Resp 16   Ht 1.702 m (5\' 7" )   Wt 74.4 kg   SpO2 100%   BMI 25.69 kg/m   Physical Exam Vitals and nursing note reviewed.  Constitutional:      Appearance: Normal appearance. She is well-developed.  HENT:     Head: Atraumatic.     Nose: Nose normal.     Mouth/Throat:     Mouth: Mucous membranes are moist.  Eyes:     General: No scleral icterus.    Conjunctiva/sclera: Conjunctivae normal.  Neck:     Trachea: No tracheal deviation.  Cardiovascular:     Rate and Rhythm: Normal rate.     Pulses: Normal pulses.  Pulmonary:     Effort: Pulmonary effort is normal. No respiratory distress.  Abdominal:     General: There is no distension.  Genitourinary:    Comments: No cva tenderness.  Musculoskeletal:     Cervical back: Normal range of motion  and neck supple. No rigidity. No muscular tenderness.     Comments: RLE of normal warmth and color. No gross swelling noted. Prior transmet amputation/stump appears good/normal, with no sign of infection. Dp/pt palp. Good rom at knee and ankle without pain.  LLE with prior BKA and no sign of infection.   Skin:    General: Skin is warm and dry.     Findings: No rash.  Neurological:     Mental Status: She is alert.     Comments: Alert, speech normal. RLE motors/sens grossly intact.   Psychiatric:        Mood and Affect: Mood normal.    ED Results / Procedures / Treatments   Labs (all labs ordered are listed, but only abnormal results are displayed) Labs Reviewed - No data to display  EKG None  Radiology US Venous Img Lower Right (DVT Study)  Result Date: 10/19/2020 CLINICAL  DATA:  Right leg pain EXAM: RIGHT LOWER EXTREMITY VENOUS DOPPLER ULTRASOUND TECHNIQUE: Gray-scale sonography with compression, as well as color and duplex ultrasound, were performed to evaluate the deep venous system(s) from the level of the common femoral vein through the popliteal and proximal calf veins. COMPARISON:  None. FINDINGS: VENOUS Normal compressibility of the common femoral, superficial femoral, and popliteal veins, as well as the visualized calf veins. Visualized portions of profunda femoral vein and great saphenous vein unremarkable. No filling defects to suggest DVT on grayscale or color Doppler imaging. Doppler waveforms show normal direction of venous flow, normal respiratory plasticity and response to augmentation. Limited views of the contralateral common femoral vein are unremarkable. OTHER Small left inguinal lymph nodes, none pathologically enlarged. Limitations: Visualization of the calf veins limited due to soft tissue calcifications. IMPRESSION: No visible right lower extremity DVT. Limited evaluation of the calf veins due to soft tissue calcifications. Electronically Signed   By: Charlett Nose M.D.   On: 10/19/2020 12:07    Procedures Procedures   Medications Ordered in ED Medications  acetaminophen (TYLENOL) tablet 1,000 mg (1,000 mg Oral Given 10/19/20 1030)    ED Course  I have reviewed the triage vital signs and the nursing notes.  Pertinent labs & imaging results that were available during my care of the patient were reviewed by me and considered in my medical decision making (see chart for details).    MDM Rules/Calculators/A&P                          U/s ordered to r/o dvt.   Reviewed nursing notes and prior charts for additional history.   Acetaminophen po.  U/s reviewed/interpreted by me - no dvt.   RLE and left stump appear normal. ?possible diabetic neuropathy related pain.   Rec acetaminophen prn, pcp f/u.      Final Clinical Impression(s) / ED  Diagnoses Final diagnoses:  None    Rx / DC Orders ED Discharge Orders     None        Cathren Laine, MD 10/19/20 1254

## 2020-10-19 NOTE — ED Triage Notes (Signed)
Both legs hurting x 3 days , just moved here from [place] ,hx of  blood clots in both legs  but states on meds for that excet asa

## 2020-10-30 ENCOUNTER — Other Ambulatory Visit: Payer: Self-pay

## 2020-10-30 ENCOUNTER — Ambulatory Visit
Admission: RE | Admit: 2020-10-30 | Discharge: 2020-10-30 | Disposition: A | Discharge status: Home / Self Care | Payer: 59 | Source: Ambulatory Visit | Attending: Internal Medicine | Admitting: Internal Medicine

## 2020-10-30 DIAGNOSIS — R413 Other amnesia: Secondary | ICD-10-CM

## 2020-10-30 IMAGING — MR MR HEAD W/O CM
11 series · 48 of 48 positions shown · non-contrast
Comparison: No pertinent prior exams available for comparison.

CLINICAL DATA: Memory loss [SN] ([SN]-CM). Additional history
provided by scanning technologist: Extreme confusion, forgetfulness
per patient's family.

EXAM:
MRI HEAD WITHOUT CONTRAST
TECHNIQUE: Multiplanar, multiecho pulse sequences of the brain and surrounding
structures were obtained without intravenous contrast.

[Series 5: T1 · sagittal · 4.0mm · 0.75mm/px · 2 of 31 slices shown (1 of 2)]
[im 1/31]
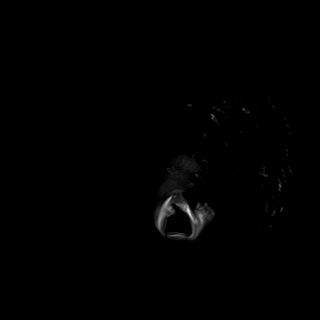
[im 31/31]
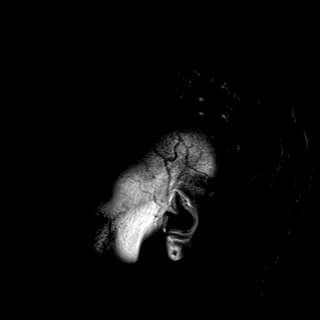

[Series 6: DWI · axial · 3.0mm · 0.94mm/px · z∈[-57,+82]mm · 10 of 159 slices shown (1 of 3)]
[im 1/159]
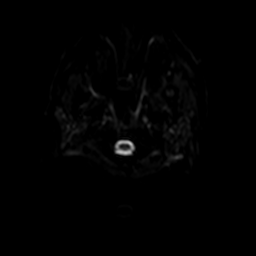
[im 18/159]
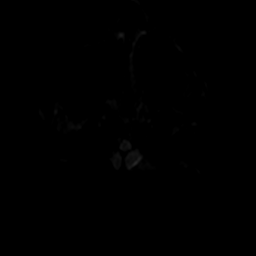
[im 36/159]
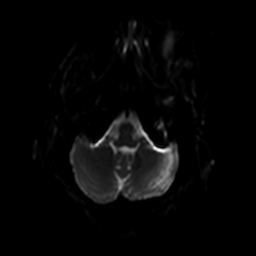
[im 53/159]
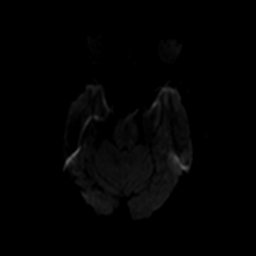
[im 71/159]
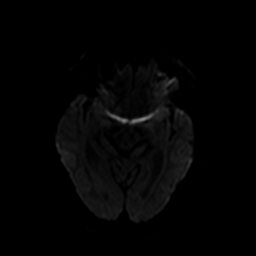
[im 88/159]
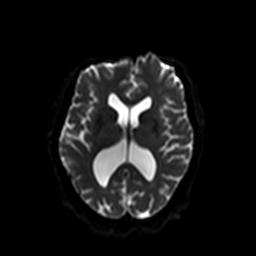
[im 106/159]
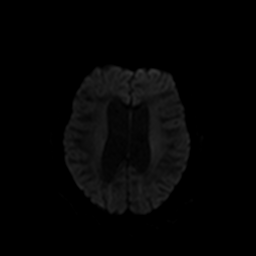
[im 123/159]
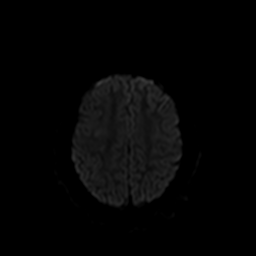
[im 141/159]
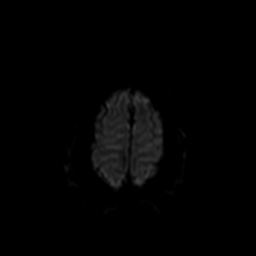
[im 159/159]
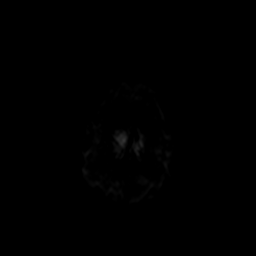

[Series 7: ax dwi_tracew · axial · 3.0mm · 0.94mm/px · z∈[-57,+82]mm · 5 of 79 slices shown]
[im 1/79]
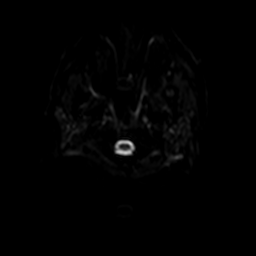
[im 20/79]
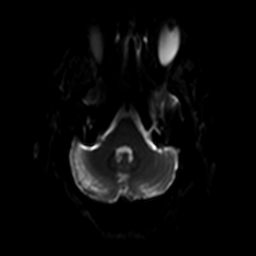
[im 40/79]
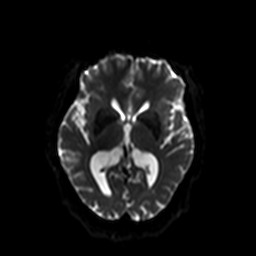
[im 59/79]
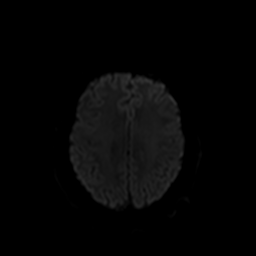
[im 79/79]
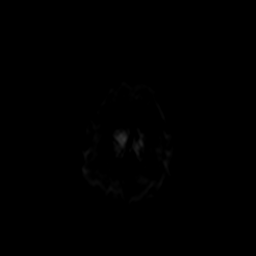

[Series 8: ax dwi_adc · axial · 3.0mm · 0.94mm/px · z∈[-57,+82]mm · 3 of 40 slices shown]
[im 1/40]
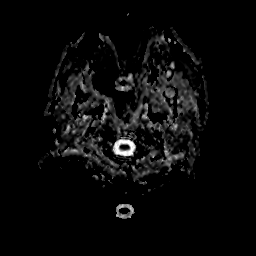
[im 20/40]
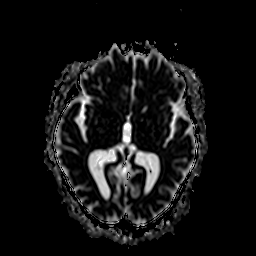
[im 40/40]
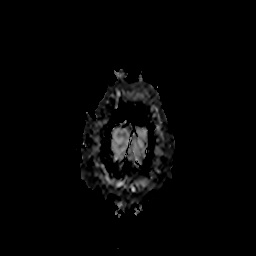

[Series 9: DWI · coronal · 5.0mm · 1.44mm/px · 4 of 60 slices shown (2 of 3)]
[im 1/60]
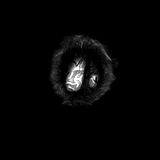
[im 20/60]
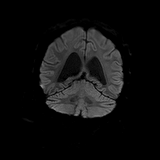
[im 40/60]
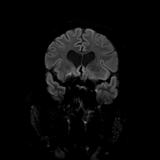
[im 60/60]
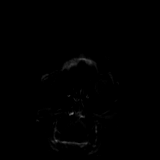

[Series 10: DWI · coronal · 5.0mm · 1.44mm/px · 2 of 30 slices shown (3 of 3)]
[im 1/30]
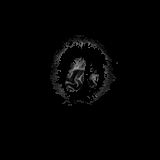
[im 30/30]
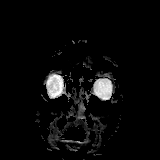

[Series 11: T2 · axial · 4.0mm · 0.36mm/px · z∈[-54,+80]mm · 2 of 27 slices shown (1 of 2)]
[im 1/27]
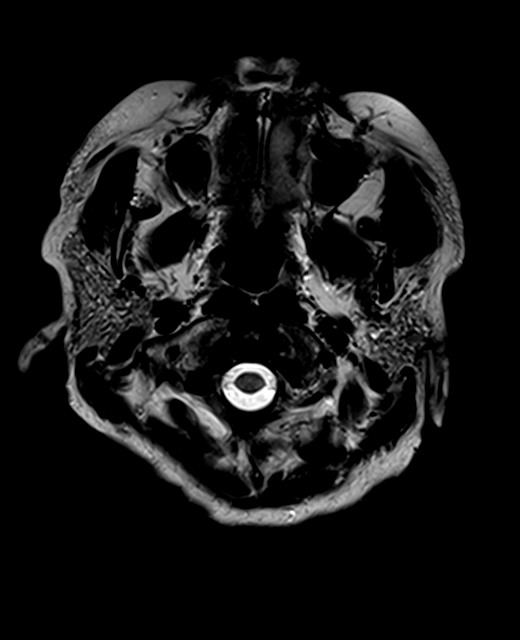
[im 27/27]
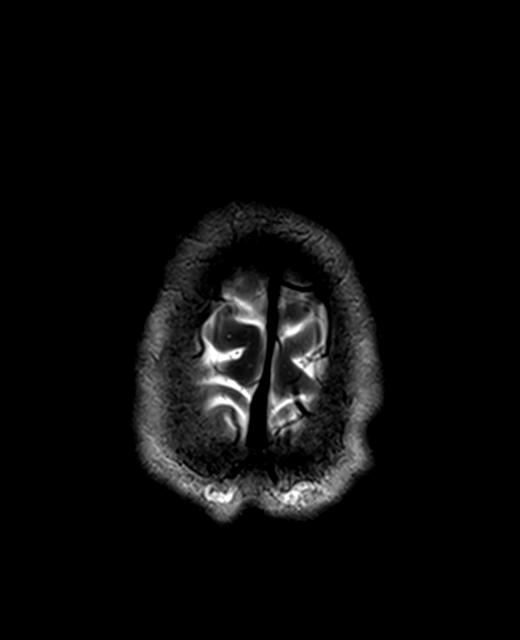

[Series 12: FLAIR · axial · 3.0mm · 0.72mm/px · z∈[-62,+87]mm · 2 of 26 slices shown]
[im 1/26]
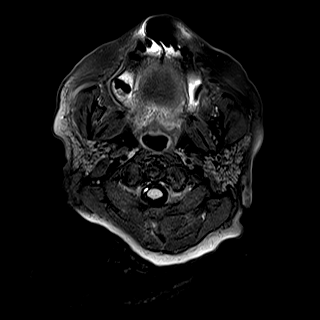
[im 26/26]
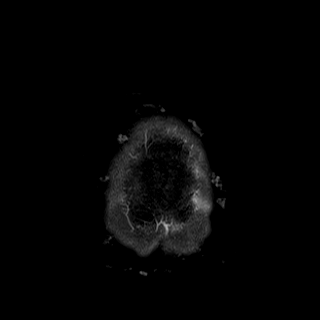

[Series 13: swi_images · axial · 1.5mm · 0.90mm/px · z∈[-58,+84]mm · 6 of 96 slices shown]
[im 1/96]
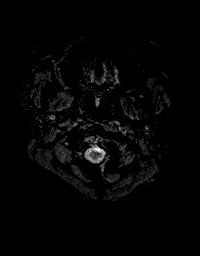
[im 20/96]
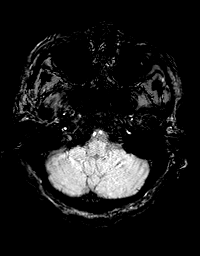
[im 39/96]
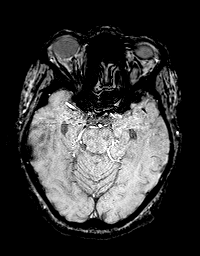
[im 58/96]
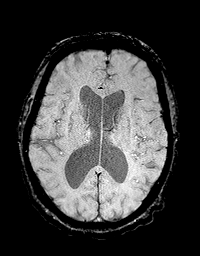
[im 77/96]
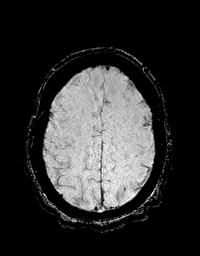
[im 96/96]
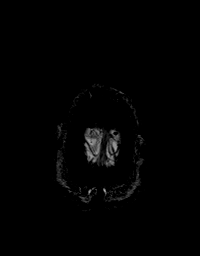

[Series 15: T1 · axial · 1.0mm · 0.94mm/px · z∈[-65,+93]mm · 10 of 160 slices shown (2 of 2)]
[im 1/160]
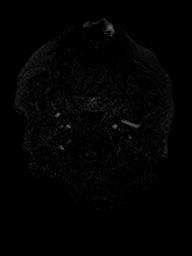
[im 18/160]
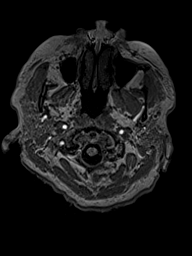
[im 36/160]
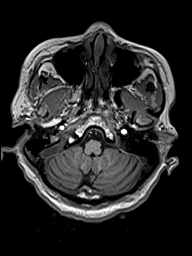
[im 54/160]
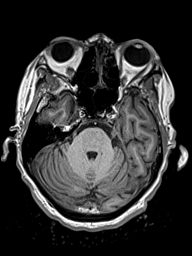
[im 71/160]
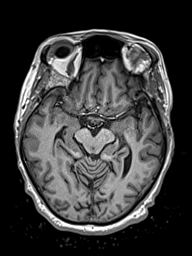
[im 89/160]
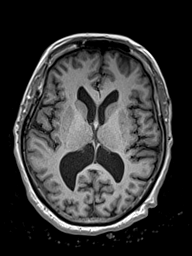
[im 107/160]
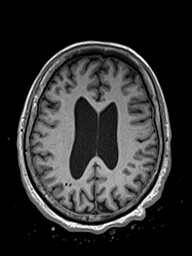
[im 124/160]
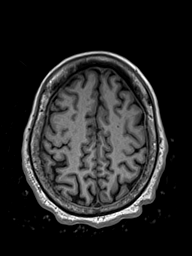
[im 142/160]
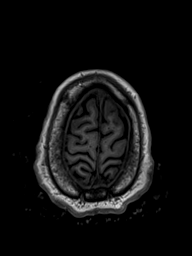
[im 160/160]
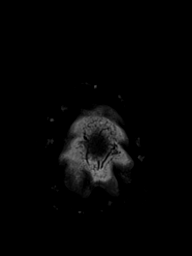

[Series 16: T2 · coronal · 4.5mm · 0.36mm/px · 2 of 30 slices shown (2 of 2)]
[im 1/30]
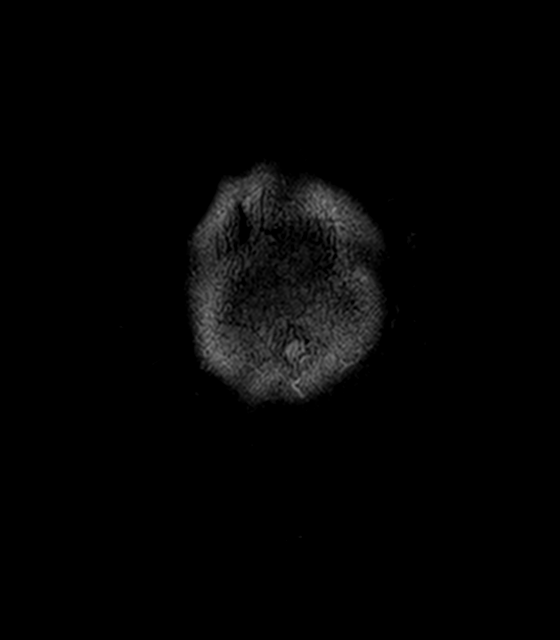
[im 30/30]
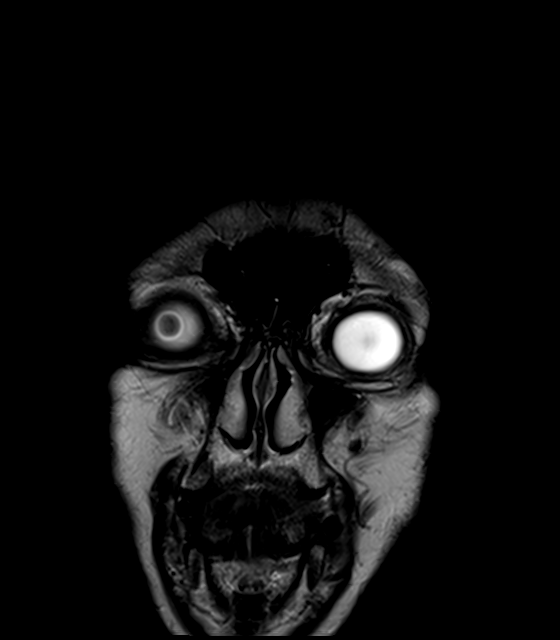

[48 of 48 positions shown; findings below may reference images not displayed]

FINDINGS: Brain:

Mild generalized cerebral and cerebellar atrophy.

Minimal multifocal T2/FLAIR hyperintensity within the cerebral white
matter, nonspecific but most often secondary to chronic small vessel
ischemia.

There are a few scattered supratentorial chronic microhemorrhages,
nonspecific but likely reflecting sequela of hypertensive
microangiopathy.

There is no acute infarct.

No evidence of an intracranial mass.

No extra-axial fluid collection.

No midline shift.

Vascular: Maintained flow voids within the proximal large arterial
vessels.

Skull and upper cervical spine: No focal marrow lesion.

Sinuses/Orbits: Visualized orbits show no acute finding. No
significant paranasal sinus disease at the imaged levels.
IMPRESSION: No evidence of acute intracranial abnormality.

Minimal multifocal T2/FLAIR hyperintense signal changes within the
cerebral white matter, nonspecific but most often secondary to
chronic small vessel ischemia.

There are a few nonspecific scattered supratentorial chronic
microhemorrhages, possibly reflecting sequela of hypertensive
microangiopathy.

Mild generalized cerebral and cerebellar atrophy.

## 2021-01-29 ENCOUNTER — Ambulatory Visit (INDEPENDENT_AMBULATORY_CARE_PROVIDER_SITE_OTHER): Payer: Medicare HMO | Admitting: Orthopedic Surgery

## 2021-01-29 ENCOUNTER — Encounter: Payer: Self-pay | Admitting: Orthopedic Surgery

## 2021-01-29 DIAGNOSIS — S88112A Complete traumatic amputation at level between knee and ankle, left lower leg, initial encounter: Secondary | ICD-10-CM

## 2021-01-29 DIAGNOSIS — Z89512 Acquired absence of left leg below knee: Secondary | ICD-10-CM | POA: Diagnosis not present

## 2021-01-29 NOTE — Progress Notes (Signed)
Office Visit Note   Patient: Brandi Hernandez           Date of Birth: [birth year]           MRN: [id number] Visit Date: 01/29/2021              Requested by: No referring provider defined for this encounter. PCP: Pcp, No  Chief Complaint  Patient presents with   Left Leg - Follow-up    Hx left bka      HPI: Patient is a 60 year old woman who is seen for initial evaluation for a left transtibial amputation.  She is approximately 9 months out from her amputation.  She has just moved here from [facility].  Patient is currently wearing a size 4 shrinker.  Assessment & Plan: Visit Diagnoses:  1. Below-knee amputation of left lower extremity (HCC)     Plan: The shrinker is too loose and she was given a extra-large under liner shrinker to wear underneath her existing Stryker.  She was given a prescription for restore for a K2 level prosthesis.  Order for physical therapy placed.  Patient will need a custom molded shoe insert and toe filler with longitudinal arch support to stabilize the residual foot inside the shoe to prevent plantar shear.  Follow-Up Instructions: Return in about 2 months (around 03/31/2021).   Ortho Exam  Patient is alert, oriented, no adenopathy, well-dressed, normal affect, normal respiratory effort. Examination patient does have some persistent swelling in the residual limb on the left.  Her thigh measures 43 cm in circumference calf is 37 cm and can circumference.  There is no redness no cellulitis no drainage patient needs to improve compression.  Patient is an existing left transtibial  amputee.  Patient's current comorbidities are not expected to impact the ability to function with the prescribed prosthesis. Patient verbally communicates a strong desire to use a prosthesis. Patient currently requires mobility aids to ambulate without a prosthesis.  Expects not to use mobility aids with a new prosthesis.  Patient is a K2 level ambulator that  will use a prosthesis to walk around their home and the community over low level environmental barriers.        Imaging: No results found. No images are attached to the encounter.  Labs: No results found for: HGBA1C, ESRSEDRATE, CRP, LABURIC, REPTSTATUS, GRAMSTAIN, CULT, LABORGA   No results found for: ALBUMIN, PREALBUMIN, CBC  No results found for: MG No results found for: VD25OH  No results found for: PREALBUMIN No flowsheet data found.   There is no height or weight on file to calculate BMI.  Orders:  Orders Placed This Encounter  Procedures   Ambulatory referral to Physical Therapy   No orders of the defined types were placed in this encounter.    Procedures: No procedures performed  Clinical Data: No additional findings.  ROS:  All other systems negative, except as noted in the HPI. Review of Systems  Objective: Vital Signs: There were no vitals taken for this visit.  Specialty Comments:  No specialty comments available.  PMFS History: Patient Active Problem List   Diagnosis Date Noted   Peripheral neuropathy 10/19/2020   Hypercholesterolemia 10/19/2020   Memory deficit 10/19/2020   Deep vein thrombosis (DVT) (HCC) 10/19/2020   Past Medical History:  Diagnosis Date   Deep vein thrombosis (DVT) (HCC)    Diabetes mellitus without complication (HCC)    Hypercholesterolemia    Hypertension    Memory deficit    Peripheral  neuropathy    Renal disorder     History reviewed. No pertinent family history.  Past Surgical History:  Procedure Laterality Date   LEG AMPUTATION Left    Below the knee   TOE AMPUTATION Right    Social History   Occupational History   Not on file  Tobacco Use   Smoking status: Never   Smokeless tobacco: Never  Vaping Use   Vaping Use: Never used  Substance and Sexual Activity   Alcohol use: Not on file   Drug use: Not on file   Sexual activity: Not on file

## 2021-02-16 ENCOUNTER — Encounter: Payer: Self-pay | Admitting: Orthopedic Surgery

## 2021-02-16 ENCOUNTER — Encounter: Payer: Medicare HMO | Admitting: Physical Therapy

## 2021-03-22 ENCOUNTER — Emergency Department (HOSPITAL_COMMUNITY): Payer: Medicare HMO

## 2021-03-22 ENCOUNTER — Emergency Department (HOSPITAL_COMMUNITY)
Admission: EM | Admit: 2021-03-22 | Discharge: 2021-03-22 | Disposition: A | Discharge status: Home / Self Care | Payer: Medicare HMO | Attending: Emergency Medicine | Admitting: Emergency Medicine

## 2021-03-22 ENCOUNTER — Encounter (HOSPITAL_COMMUNITY): Payer: Self-pay

## 2021-03-22 ENCOUNTER — Other Ambulatory Visit: Payer: Self-pay

## 2021-03-22 DIAGNOSIS — R202 Paresthesia of skin: Secondary | ICD-10-CM | POA: Insufficient documentation

## 2021-03-22 DIAGNOSIS — Z794 Long term (current) use of insulin: Secondary | ICD-10-CM | POA: Insufficient documentation

## 2021-03-22 DIAGNOSIS — E1140 Type 2 diabetes mellitus with diabetic neuropathy, unspecified: Secondary | ICD-10-CM | POA: Insufficient documentation

## 2021-03-22 DIAGNOSIS — R200 Anesthesia of skin: Secondary | ICD-10-CM | POA: Diagnosis present

## 2021-03-22 DIAGNOSIS — R350 Frequency of micturition: Secondary | ICD-10-CM | POA: Diagnosis not present

## 2021-03-22 DIAGNOSIS — M4800 Spinal stenosis, site unspecified: Secondary | ICD-10-CM

## 2021-03-22 DIAGNOSIS — I10 Essential (primary) hypertension: Secondary | ICD-10-CM | POA: Insufficient documentation

## 2021-03-22 DIAGNOSIS — R531 Weakness: Secondary | ICD-10-CM | POA: Diagnosis not present

## 2021-03-22 DIAGNOSIS — M21332 Wrist drop, left wrist: Secondary | ICD-10-CM

## 2021-03-22 LAB — CBC WITH DIFFERENTIAL/PLATELET
Abs Immature Granulocytes: 0.03 10*3/uL (ref 0.00–0.07)
Basophils Absolute: 0.1 10*3/uL (ref 0.0–0.1)
Basophils Relative: 1 %
Eosinophils Absolute: 0.2 10*3/uL (ref 0.0–0.5)
Eosinophils Relative: 3 %
HCT: 29.5 % — ABNORMAL LOW (ref 36.0–46.0)
Hemoglobin: 9.6 g/dL — ABNORMAL LOW (ref 12.0–15.0)
Immature Granulocytes: 0 %
Lymphocytes Relative: 28 %
Lymphs Abs: 2.3 10*3/uL (ref 0.7–4.0)
MCH: 22.1 pg — ABNORMAL LOW (ref 26.0–34.0)
MCHC: 32.5 g/dL (ref 30.0–36.0)
MCV: 68 fL — ABNORMAL LOW (ref 80.0–100.0)
Monocytes Absolute: 0.8 10*3/uL (ref 0.1–1.0)
Monocytes Relative: 10 %
Neutro Abs: 4.9 10*3/uL (ref 1.7–7.7)
Neutrophils Relative %: 58 %
Platelets: 318 10*3/uL (ref 150–400)
RBC: 4.34 MIL/uL (ref 3.87–5.11)
RDW: 19.3 % — ABNORMAL HIGH (ref 11.5–15.5)
WBC: 8.4 10*3/uL (ref 4.0–10.5)
nRBC: 0 % (ref 0.0–0.2)

## 2021-03-22 LAB — URINALYSIS, ROUTINE W REFLEX MICROSCOPIC
Bilirubin Urine: NEGATIVE
Glucose, UA: >=500 mg/dL — AB
Hgb urine dipstick: NEGATIVE
Ketones, ur: NEGATIVE mg/dL
Leukocytes,Ua: NEGATIVE
Nitrite: NEGATIVE
Protein, ur: >=300 mg/dL — AB
Specific Gravity, Urine: 1.018 (ref 1.005–1.030)
pH: 5 (ref 5.0–8.0)

## 2021-03-22 LAB — COMPREHENSIVE METABOLIC PANEL
ALT: 16 U/L (ref 0–44)
AST: 21 U/L (ref 15–41)
Albumin: 3.1 g/dL — ABNORMAL LOW (ref 3.5–5.0)
Alkaline Phosphatase: 89 U/L (ref 38–126)
Anion gap: 6 (ref 5–15)
BUN: 25 mg/dL — ABNORMAL HIGH (ref 8–23)
CO2: 27 mmol/L (ref 22–32)
Calcium: 8.9 mg/dL (ref 8.9–10.3)
Chloride: 103 mmol/L (ref 98–111)
Creatinine, Ser: 1.02 mg/dL — ABNORMAL HIGH (ref 0.44–1.00)
GFR, Estimated: >60 mL/min (ref >60)
Glucose, Bld: 320 mg/dL — ABNORMAL HIGH (ref 70–99)
Potassium: 4.5 mmol/L (ref 3.5–5.1)
Sodium: 136 mmol/L (ref 135–145)
Total Bilirubin: 0.4 mg/dL (ref 0.3–1.2)
Total Protein: 8.1 g/dL (ref 6.5–8.1)

## 2021-03-22 LAB — TROPONIN I (HIGH SENSITIVITY): Troponin I (High Sensitivity): 7 ng/L (ref <18)

## 2021-03-22 LAB — CBG MONITORING, ED: Glucose-Capillary: 283 mg/dL — ABNORMAL HIGH (ref 70–99)

## 2021-03-22 IMAGING — CR DG CHEST 2V
2 series · 2 of 2 positions shown · non-contrast
Comparison: No priors.

CLINICAL DATA: 60-year-old female with history of weakness.

EXAM:
CHEST - 2 VIEW

[w chest lat]
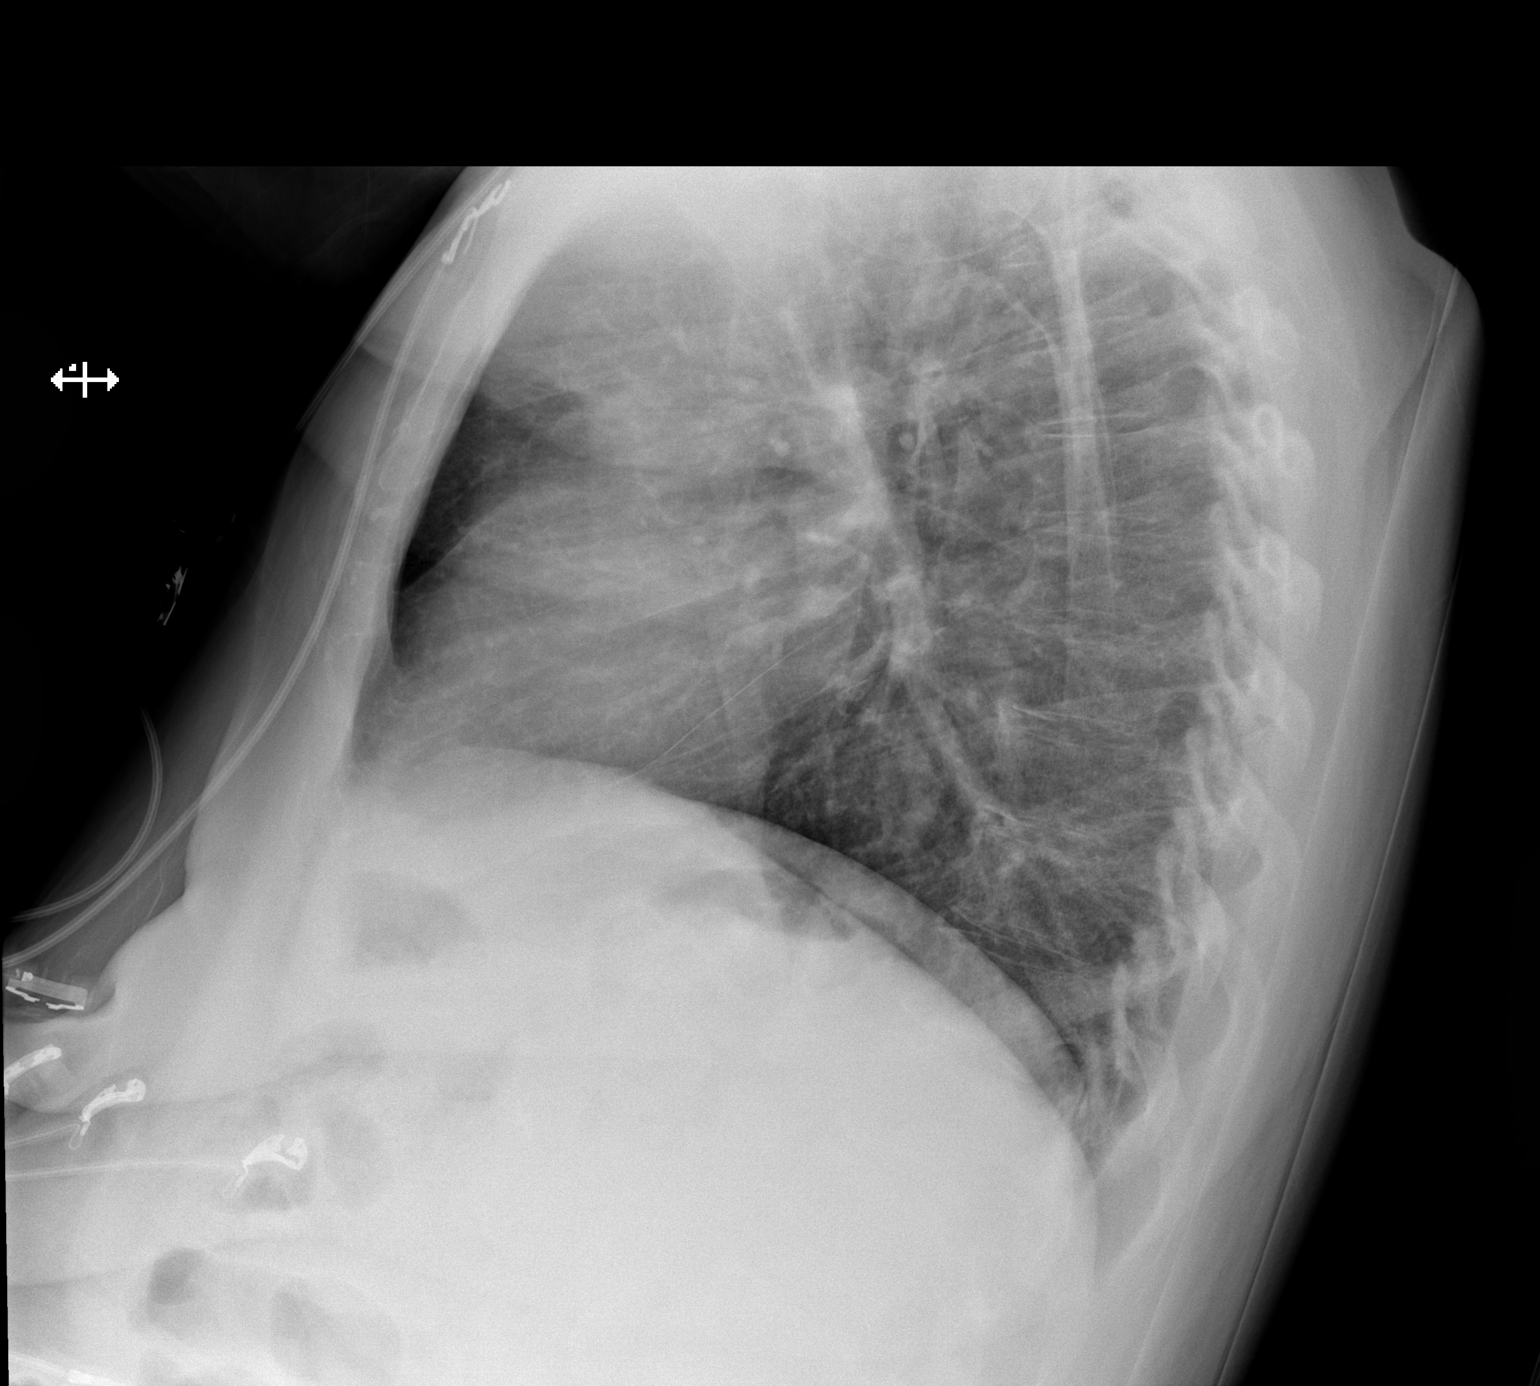

[x chest ap]
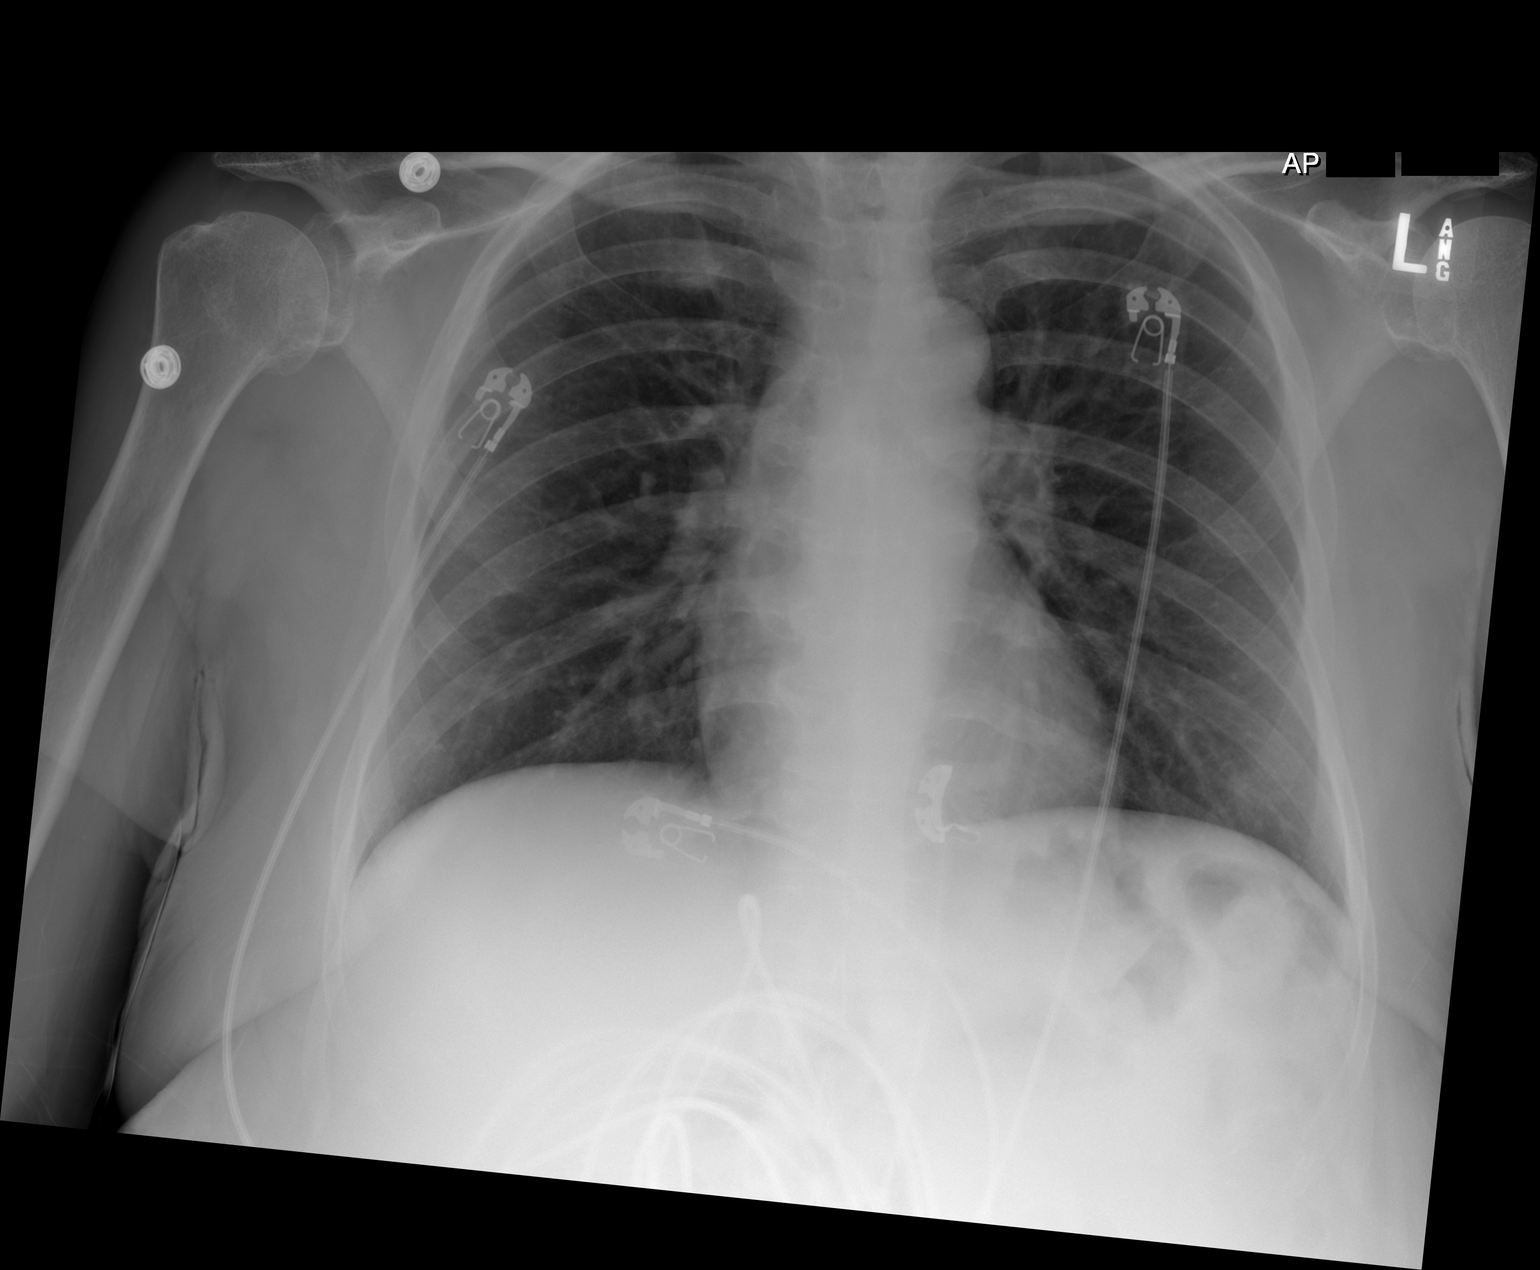

[2 of 2 positions shown; findings below may reference images not displayed]

FINDINGS: Lung volumes are normal. No consolidative airspace disease. No
pleural effusions. No pneumothorax. No pulmonary nodule or mass
noted. Pulmonary vasculature and the cardiomediastinal silhouette
are within normal limits.
IMPRESSION: No radiographic evidence of acute cardiopulmonary disease.

## 2021-03-22 IMAGING — CT CT HEAD W/O CM
3 series · 16 of 47 positions shown, 19 images · non-contrast
Comparison: Brain MRI, [DATE].

CLINICAL DATA: Mental status change of unknown cause.

EXAM:
CT HEAD WITHOUT CONTRAST
TECHNIQUE: Contiguous axial images were obtained from the base of the skull
through the vertex without intravenous contrast.

[Series 2: head wo · axial · 0.42mm/px · z∈[-130,-5]mm · 10 of 31 slices shown, 13 images]
[im 3/31  brain]
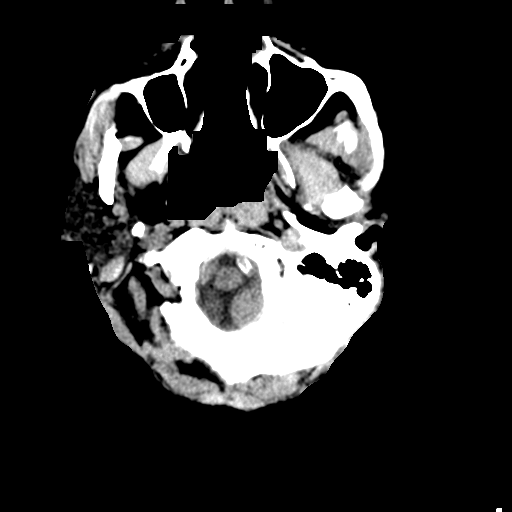
[im 3/31  bone]
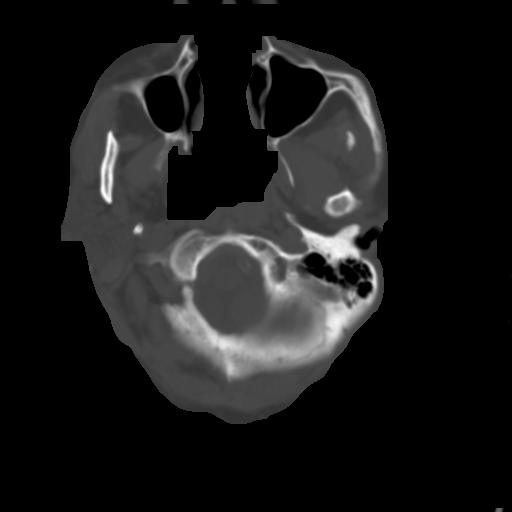
[im 6/31  brain]
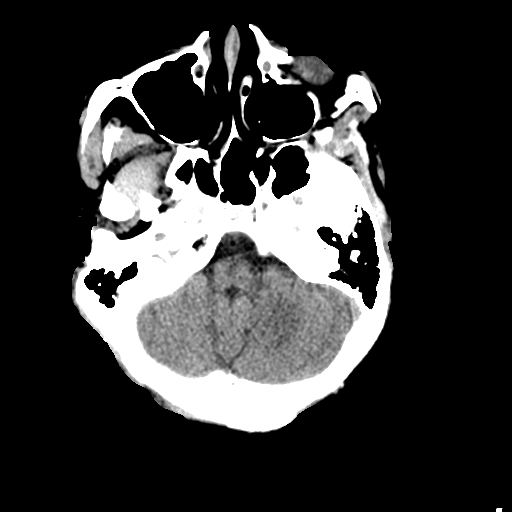
[im 9/31  brain]
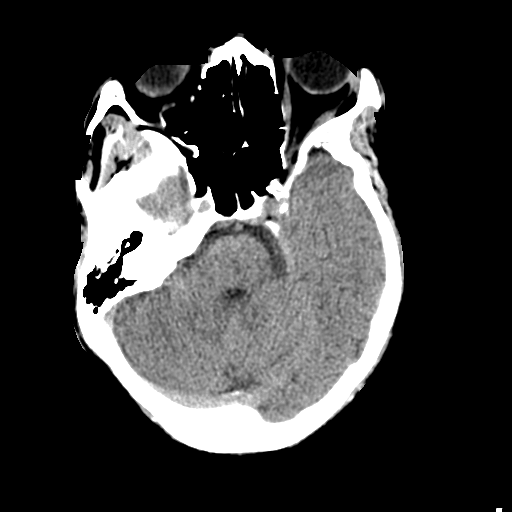
[im 11/31  brain]
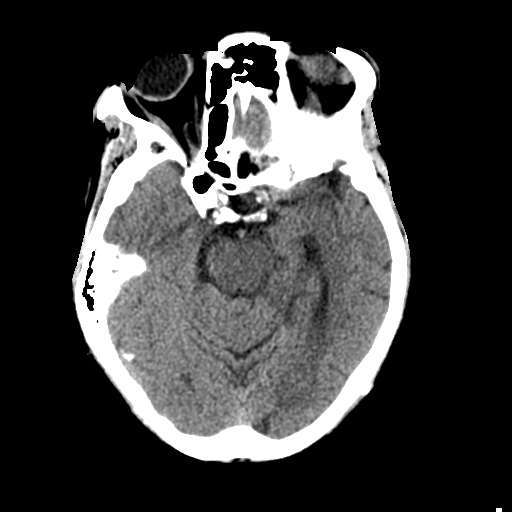
[im 14/31  brain]
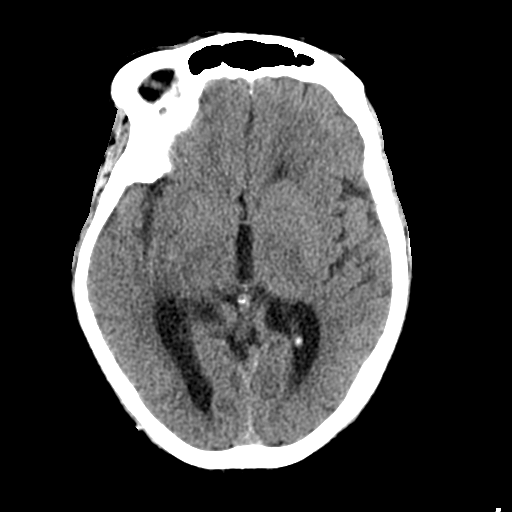
[im 14/31  bone]
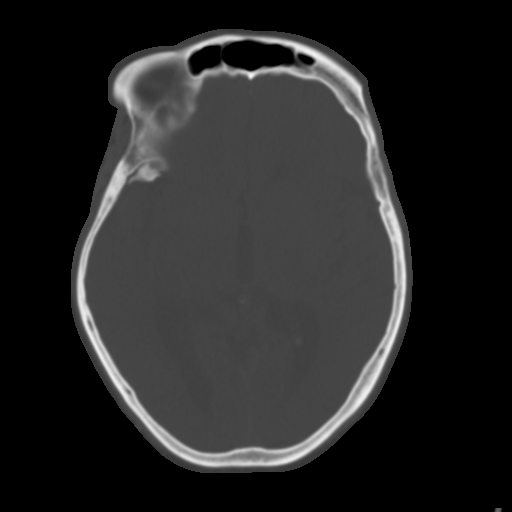
[im 17/31  brain]
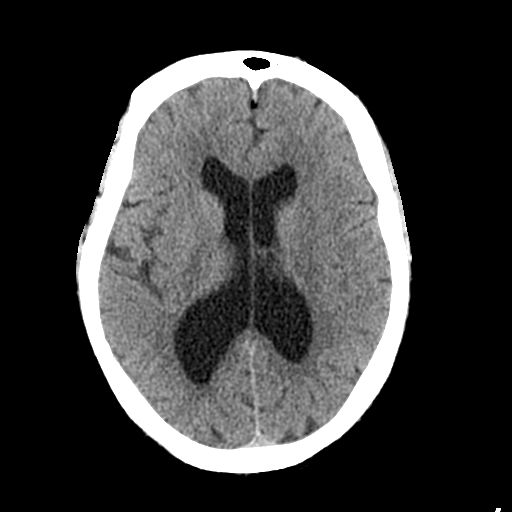
[im 20/31  brain]
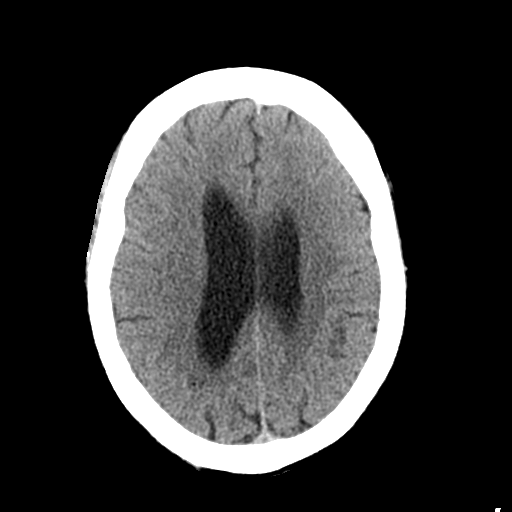
[im 23/31  brain]
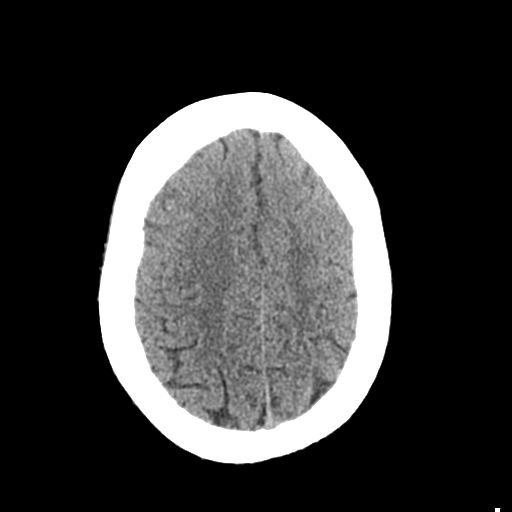
[im 25/31  brain]
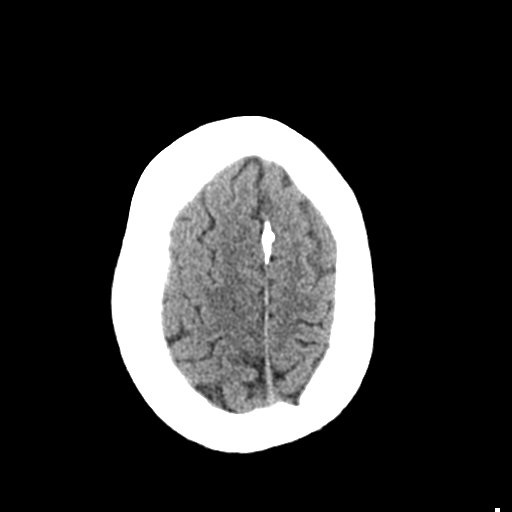
[im 25/31  bone]
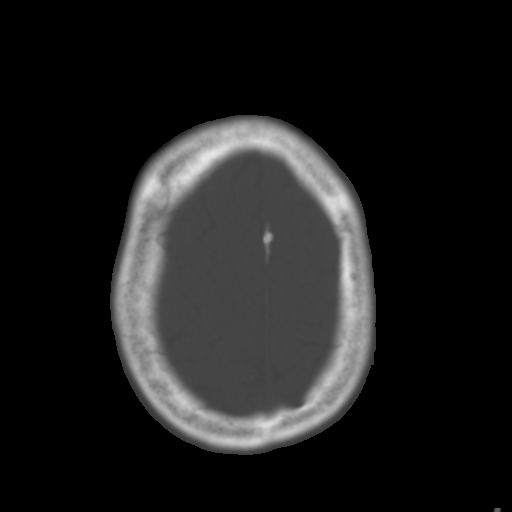
[im 28/31  brain]
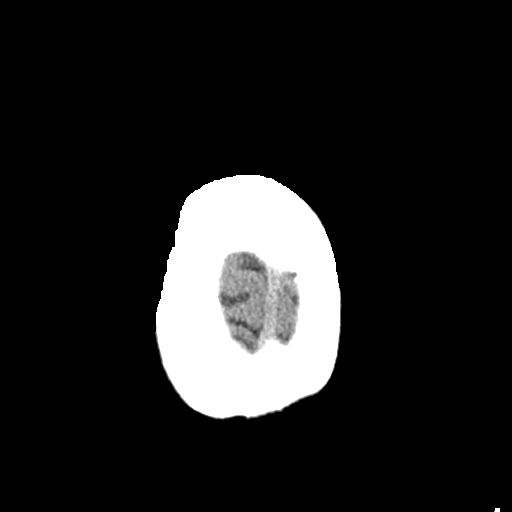

[Series 5: sagittal soft tissue · sagittal · 0.34mm/px · 3 of 58 slices shown]
[im 21/58  brain]
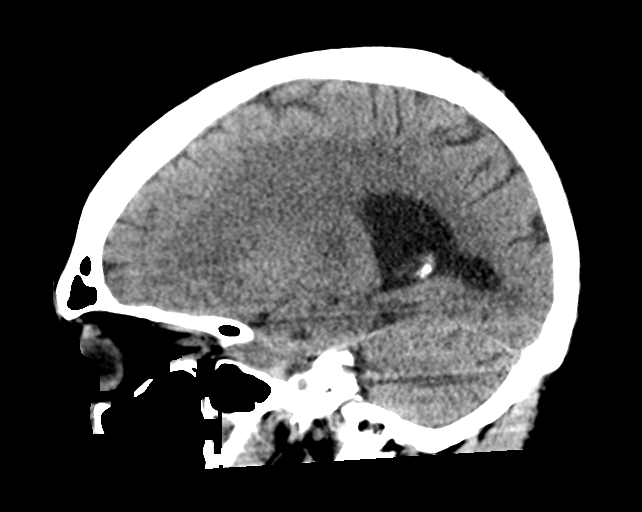
[im 29/58  brain]
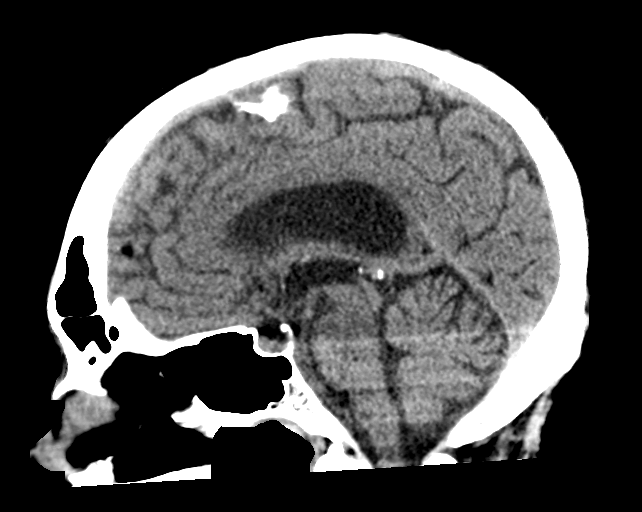
[im 37/58  brain]
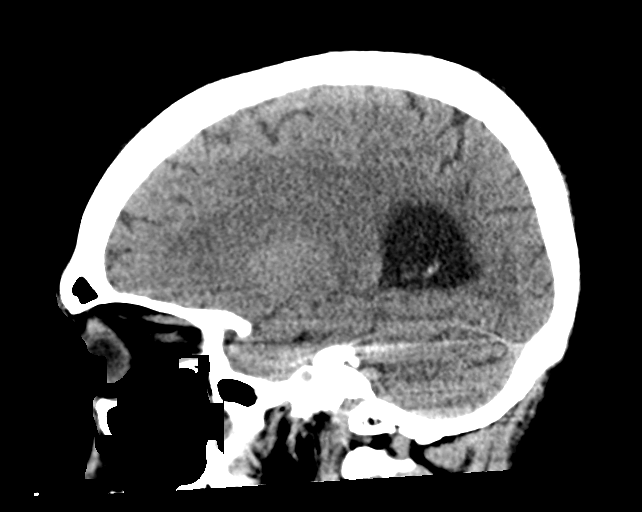

[Series 6: coronal soft tissue · coronal · 0.31mm/px · 3 of 71 slices shown]
[im 24/71  brain]
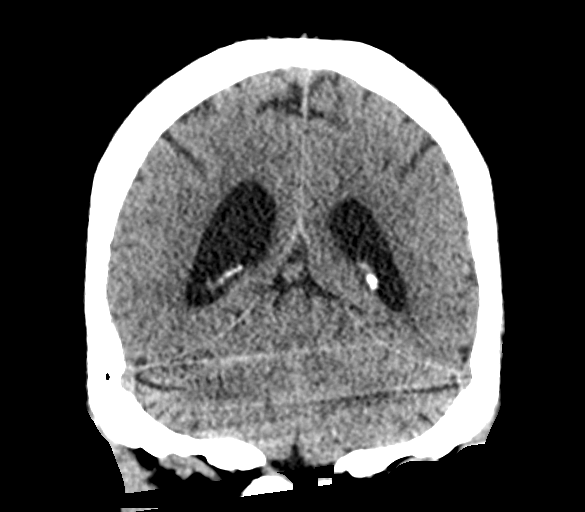
[im 32/71  brain]
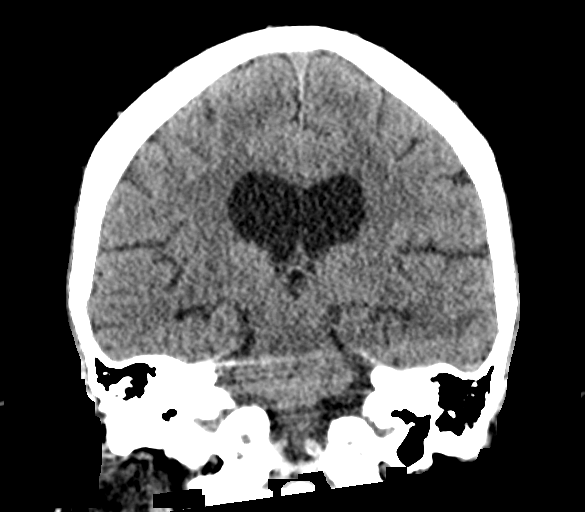
[im 39/71  brain]
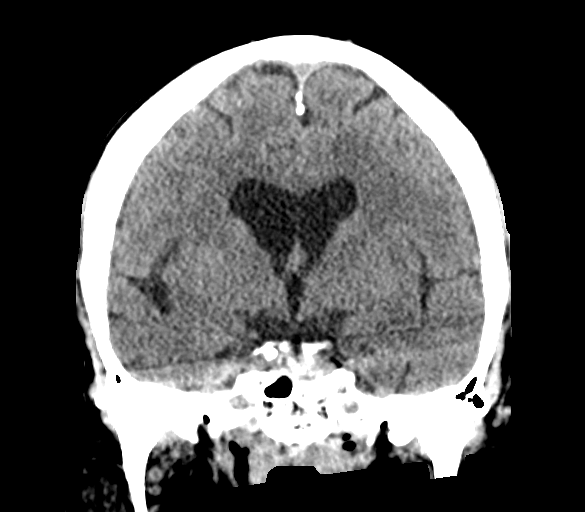

[16 of 47 positions shown; findings below may reference images not displayed]

FINDINGS: Brain: No evidence of acute infarction, hemorrhage, hydrocephalus,
extra-axial collection or mass lesion/mass effect.

Vascular: No hyperdense vessel or unexpected calcification.

Skull: Normal. Negative for fracture or focal lesion.

Sinuses/Orbits: Globes and orbits are unremarkable. Sinuses are
clear.

Other: None.
IMPRESSION: No acute intracranial abnormalities.

## 2021-03-22 IMAGING — MR MR CERVICAL SPINE WO/W CM
8 of 9 series · 36 of 48 positions shown · IV contrast (gadavist)
Comparison: None.

CLINICAL DATA: Wrist drop. Left upper extremity weakness and
paresthesias.

EXAM:
MRI CERVICAL SPINE WITHOUT AND WITH CONTRAST
TECHNIQUE: Multiplanar and multiecho pulse sequences of the cervical spine, to
include the craniocervical junction and cervicothoracic junction,
were obtained without and with intravenous contrast.
CONTRAST:  7.5mL GADAVIST GADOBUTROL 1 MMOL/ML IV SOLN

[Series 5: T2 · sagittal · 3.0mm · 0.69mm/px · 4 of 15 slices shown (1 of 2)]
[im 1/15]
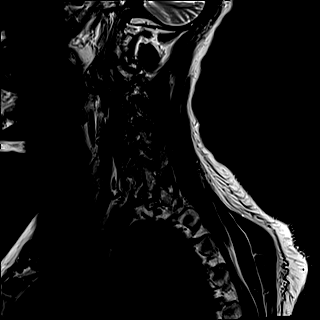
[im 5/15]
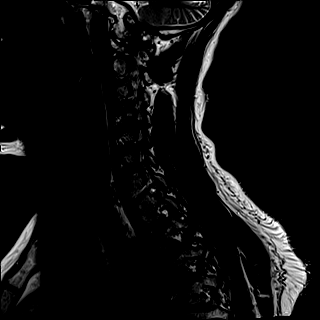
[im 10/15]
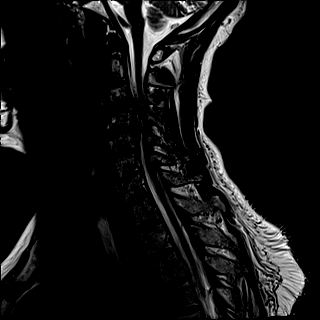
[im 15/15]
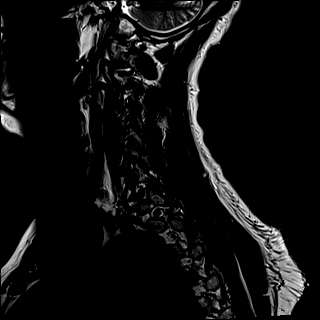

[Series 6: T1 · sagittal · 3.0mm · 0.69mm/px · 4 of 15 slices shown (1 of 2)]
[im 1/15]
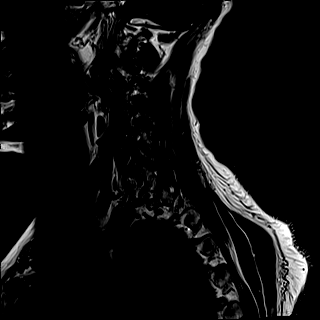
[im 5/15]
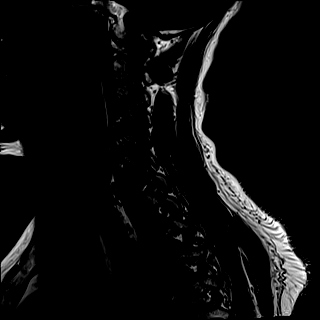
[im 10/15]
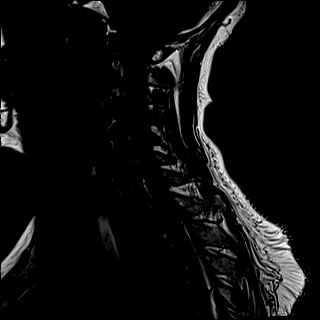
[im 15/15]
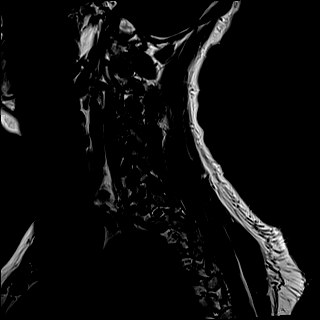

[Series 7: STIR · sagittal · 3.0mm · 0.86mm/px · 4 of 15 slices shown]
[im 1/15]
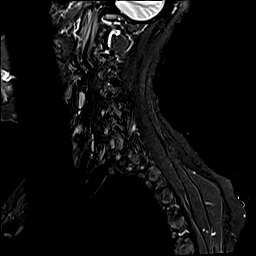
[im 5/15]
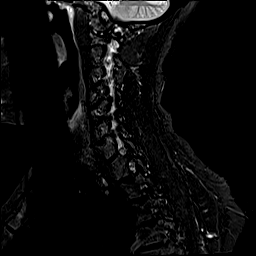
[im 10/15]
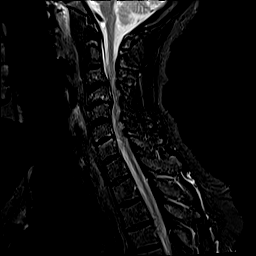
[im 15/15]
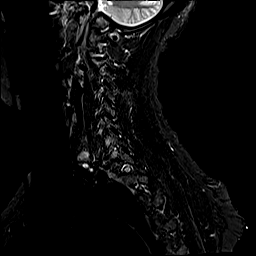

[Series 8: T2 · axial · 3.0mm · 0.70mm/px · z∈[-226,-136]mm · 7 of 28 slices shown (2 of 2)]
[im 1/28]
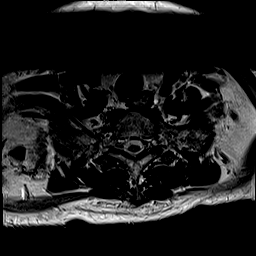
[im 5/28]
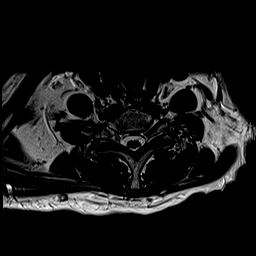
[im 10/28]
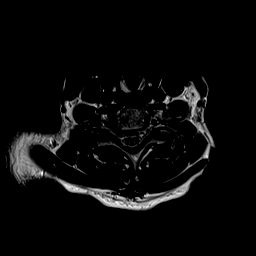
[im 14/28]
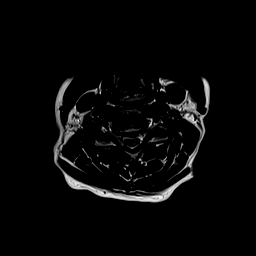
[im 19/28]
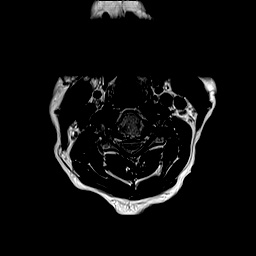
[im 23/28]
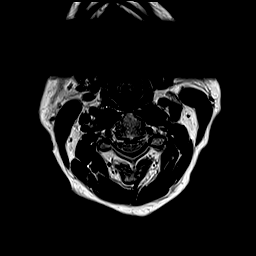
[im 28/28]
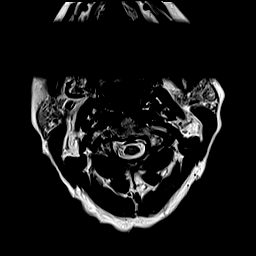

[Series 10: T1 · axial · 3.0mm · 0.35mm/px · z∈[-226,-136]mm · 7 of 28 slices shown (2 of 2)]
[im 1/28]
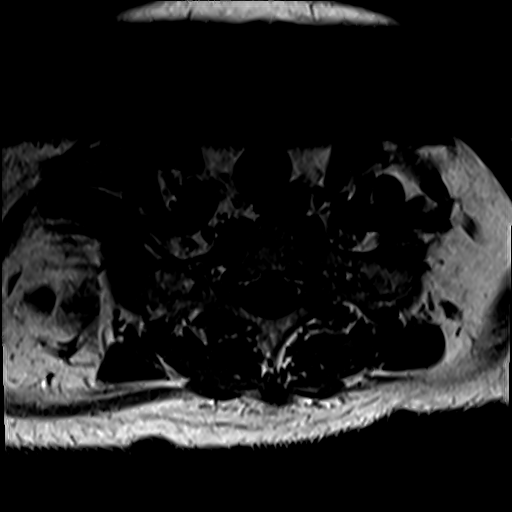
[im 5/28]
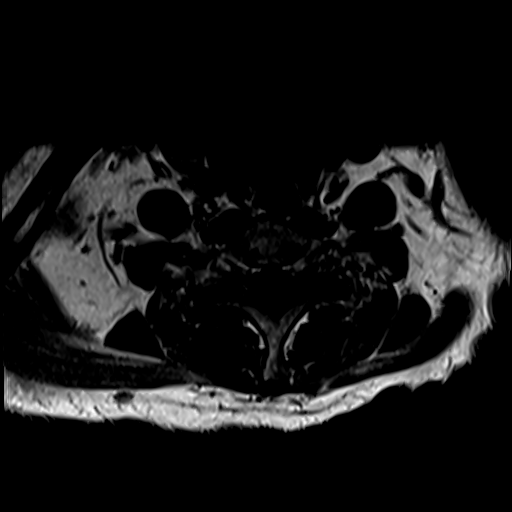
[im 10/28]
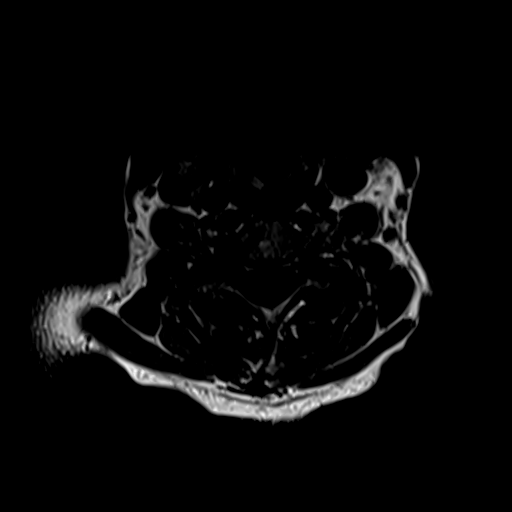
[im 14/28]
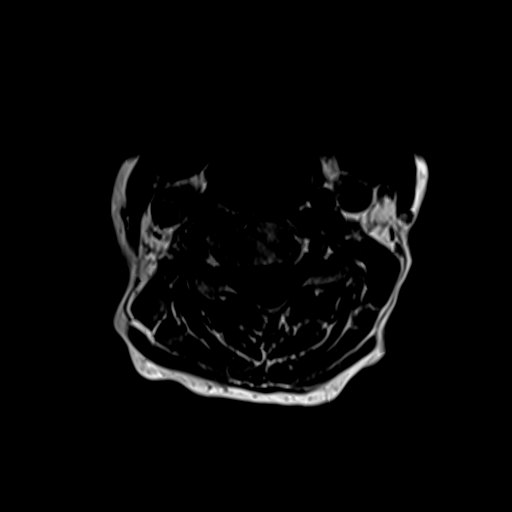
[im 19/28]
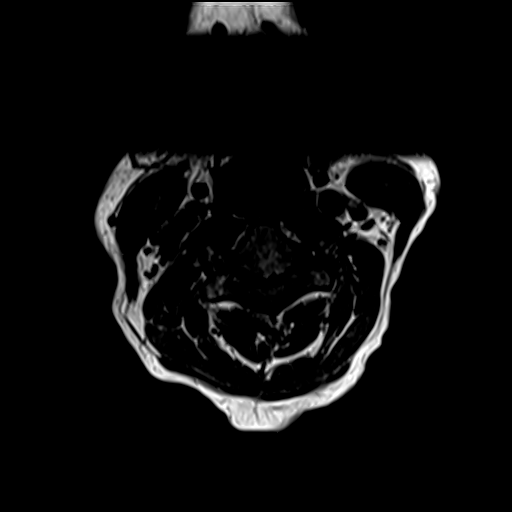
[im 23/28]
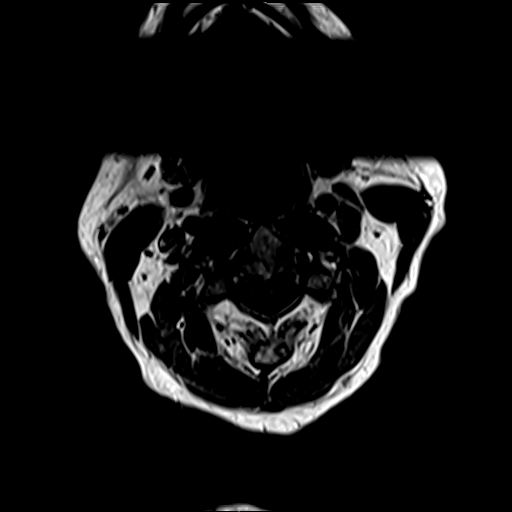
[im 28/28]
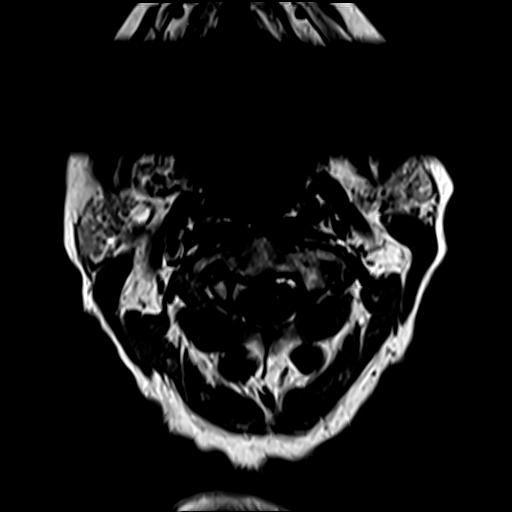

[Series 11: T1 fat-sat post-contrast · sagittal · 3.0mm · 0.86mm/px · 4 of 15 slices shown]
[im 1/15]
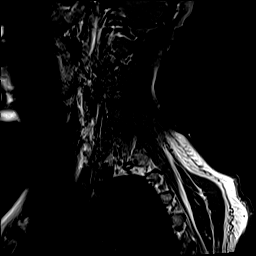
[im 5/15]
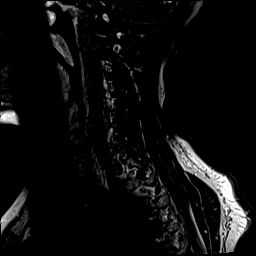
[im 10/15]
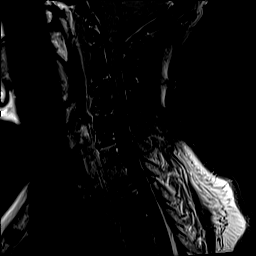
[im 15/15]
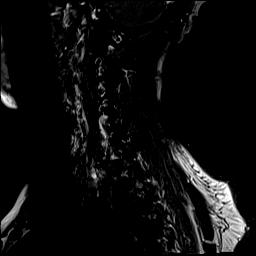

[Series 12: T1 post-contrast · axial · 3.0mm · 0.35mm/px · z∈[-226,-213]mm · 2 of 28 slices shown]
[im 1/28]
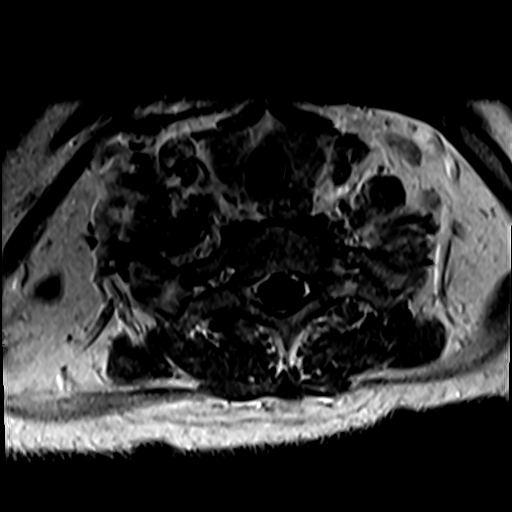
[im 5/28]
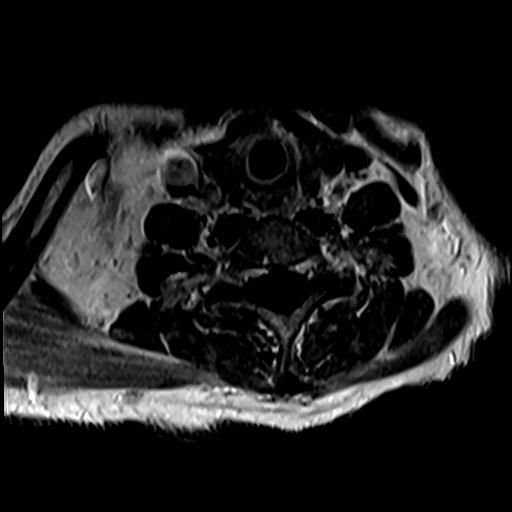

[Series 13: T2 post-contrast · sagittal · 3.0mm · 0.69mm/px · 4 of 15 slices shown]
[im 1/15]
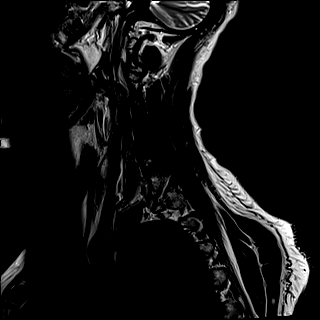
[im 5/15]
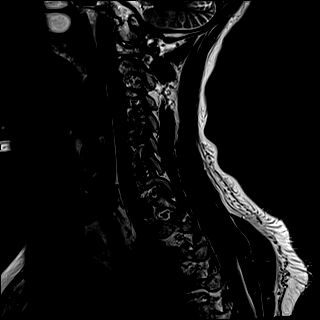
[im 10/15]
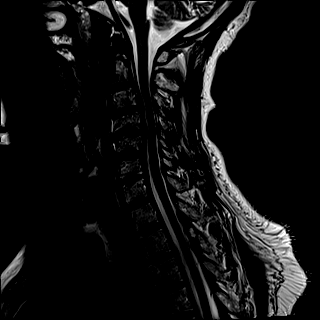
[im 15/15]
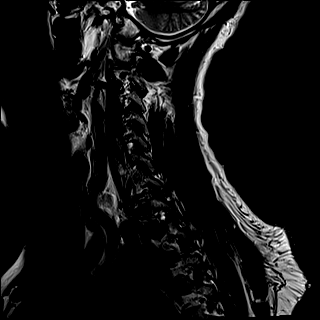

[36 of 48 positions shown; findings below may reference images not displayed]

FINDINGS: Alignment: Cervical spine straightening.  No listhesis.

Vertebrae: No fracture or suspicious marrow lesion. Degenerative
endplate changes at C4-5 and C6-7 including mild edema with
associated moderate disc space narrowing. Milder disc space
narrowing and degenerative endplate changes at C3-4.

Cord: Normal signal.  No abnormal intradural enhancement.

Posterior Fossa, vertebral arteries, paraspinal tissues:
Unremarkable.

Disc levels:

C2-3: Negative.

C3-4: Disc bulging and uncovertebral spurring result in moderate to
severe spinal stenosis with mild to moderate cord flattening and
mild left neural foraminal stenosis.

C4-5: Broad-based posterior disc osteophyte complex results in
severe spinal stenosis with moderate cord flattening and moderate
left greater than right neural foraminal stenosis.

C5-6: Disc bulging and and symmetric right uncovertebral spurring
result in mild spinal stenosis and mild-to-moderate right neural
foraminal stenosis.

C6-7: Disc bulging, a right paracentral to foraminal disc
protrusion, and and uncovertebral spurring result in mild spinal
stenosis and moderate right neural foraminal stenosis.

C7-T1: Negative.
IMPRESSION: 1. Multilevel cervical disc degeneration, worst at C4-5 where there
is severe spinal stenosis and moderate neural foraminal stenosis.
2. Moderate to severe spinal stenosis at C3-4.
3. Mild spinal stenosis and moderate right neural foraminal stenosis
at C6-7.

## 2021-03-22 IMAGING — MR MR HEAD W/O CM
11 series · 48 of 48 positions shown · non-contrast
Comparison: Head CT [DATE] and MRI [DATE]

CLINICAL DATA: TIA. Left upper extremity weakness and paresthesias.

EXAM:
MRI HEAD WITHOUT CONTRAST
TECHNIQUE: Multiplanar, multiecho pulse sequences of the brain and surrounding
structures were obtained without intravenous contrast.

[Series 5: DWI · axial · 3.0mm · 1.36mm/px · z∈[-99,+46]mm · 8 of 100 slices shown (1 of 4)]
[im 1/100]
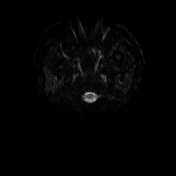
[im 15/100]
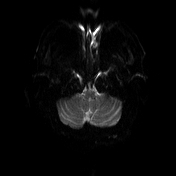
[im 29/100]
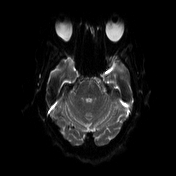
[im 43/100]
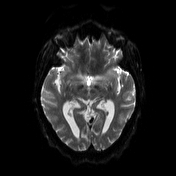
[im 57/100]
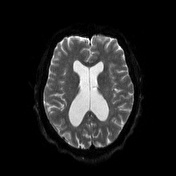
[im 71/100]
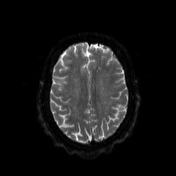
[im 85/100]
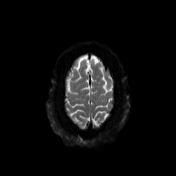
[im 100/100]
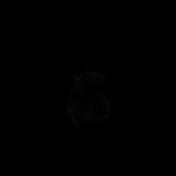

[Series 6: DWI · axial · 3.0mm · 1.36mm/px · z∈[-99,+46]mm · 4 of 50 slices shown (2 of 4)]
[im 1/50]
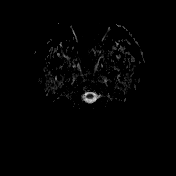
[im 17/50]
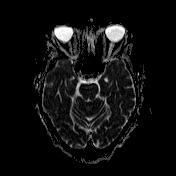
[im 33/50]
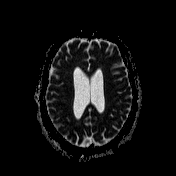
[im 50/50]
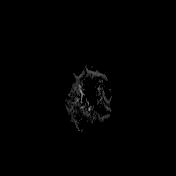

[Series 7: T2 · sagittal · 5.0mm · 0.47mm/px · 2 of 24 slices shown (1 of 3)]
[im 1/24]
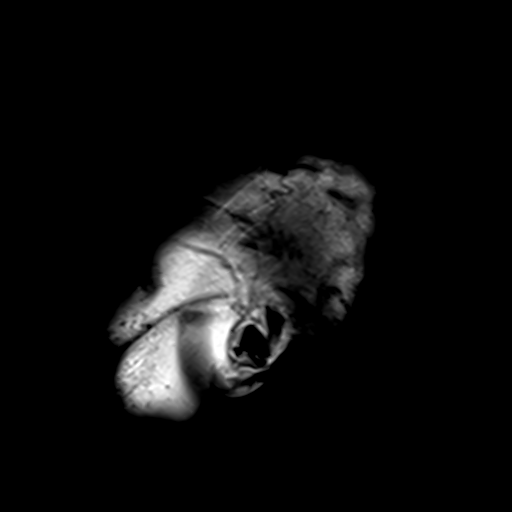
[im 24/24]
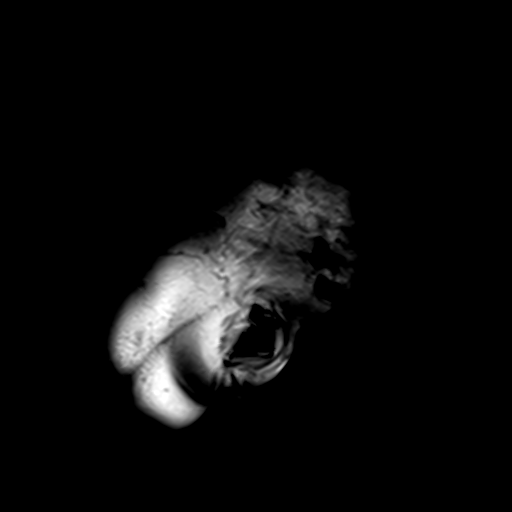

[Series 8: T2 · axial · 5.0mm · 0.45mm/px · z∈[-91,+44]mm · 2 of 22 slices shown (2 of 3)]
[im 1/22]
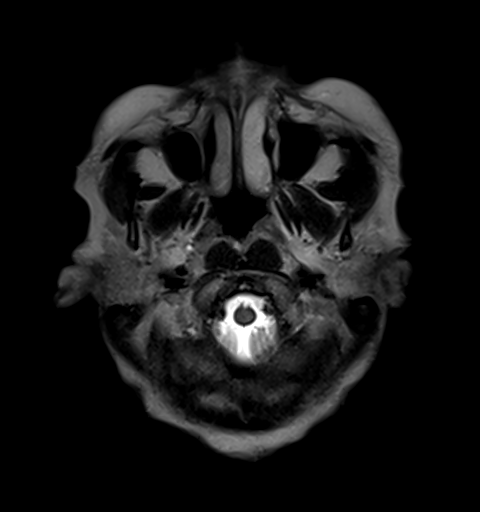
[im 22/22]
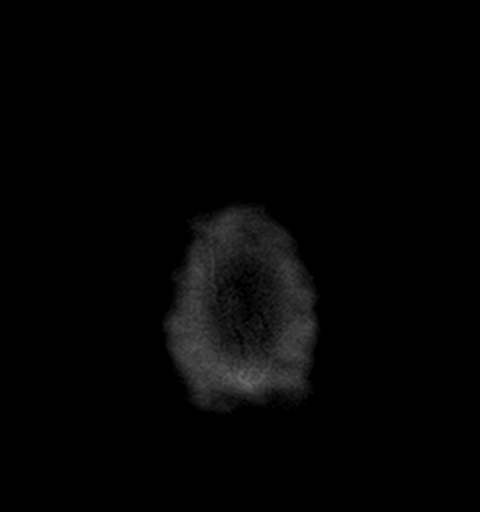

[Series 9: GRE · axial · 3.0mm · 0.45mm/px · z∈[-97,+52]mm · 4 of 51 slices shown]
[im 1/51]
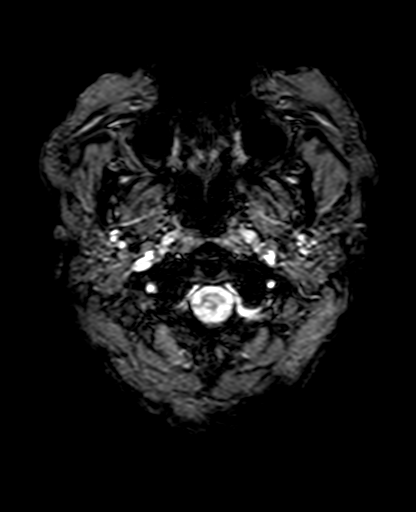
[im 17/51]
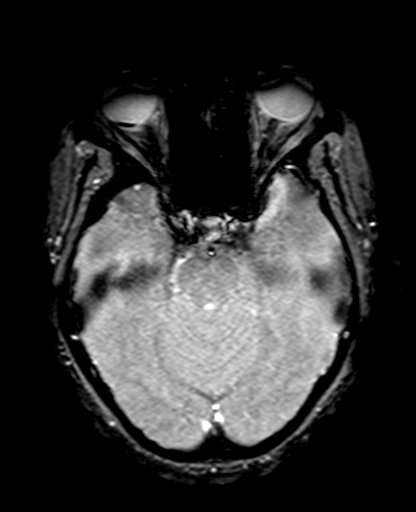
[im 34/51]
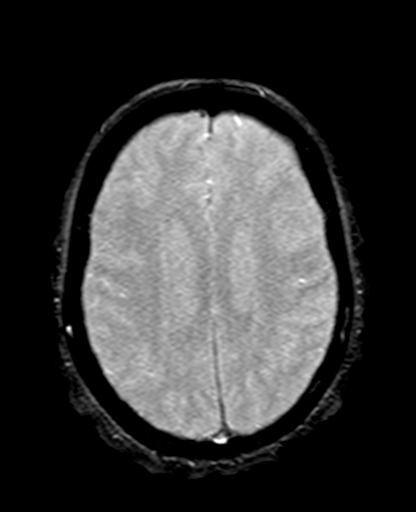
[im 51/51]
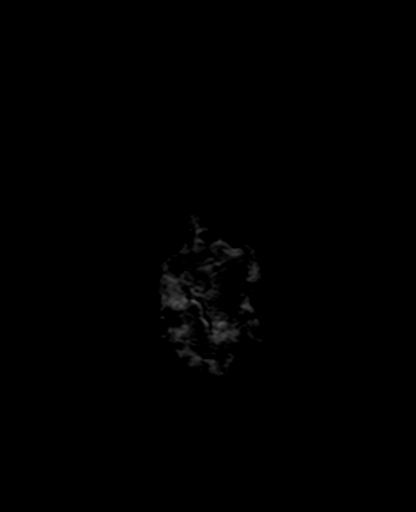

[Series 10: FLAIR · axial · 3.0mm · 0.86mm/px · z∈[-96,+53]mm · 4 of 51 slices shown]
[im 1/51]
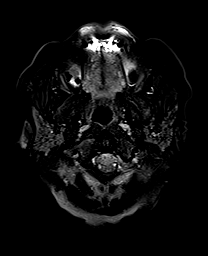
[im 17/51]
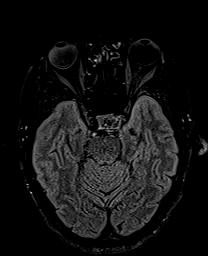
[im 34/51]
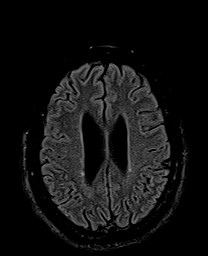
[im 51/51]
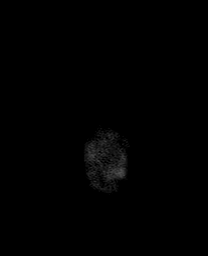

[Series 11: T1 · axial · 3.0mm · 0.45mm/px · z∈[-94,+55]mm · 4 of 51 slices shown (1 of 2)]
[im 1/51]
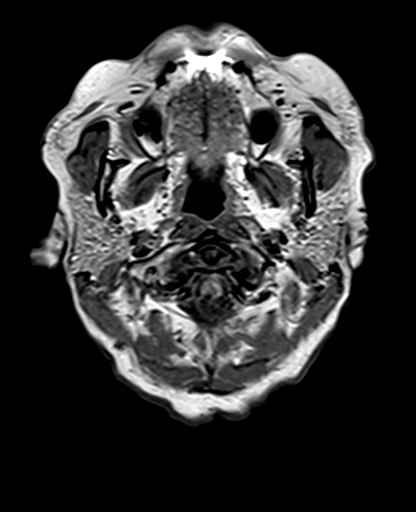
[im 17/51]
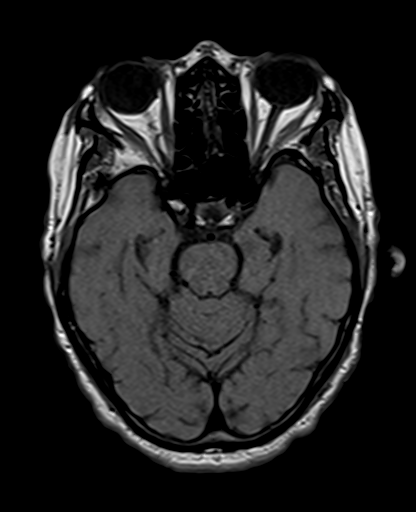
[im 34/51]
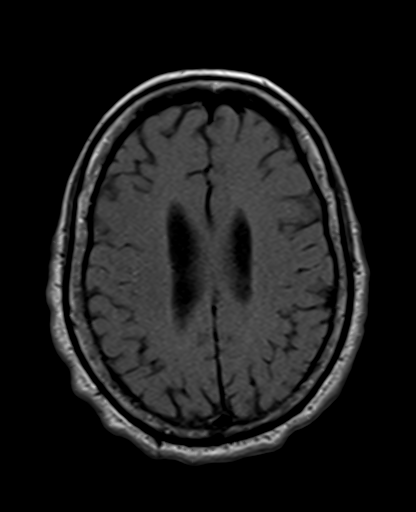
[im 51/51]
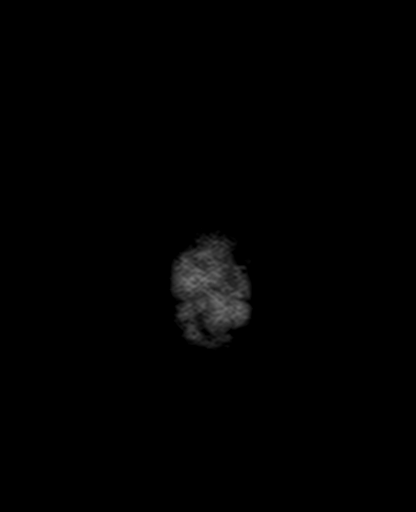

[Series 12: DWI · coronal · 5.0mm · 1.31mm/px · 4 of 56 slices shown (3 of 4)]
[im 1/56]
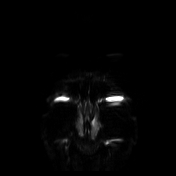
[im 19/56]
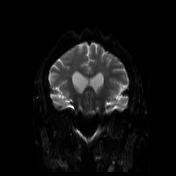
[im 37/56]
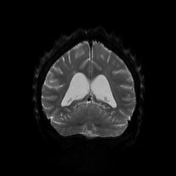
[im 56/56]
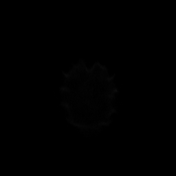

[Series 13: DWI · coronal · 5.0mm · 1.31mm/px · 2 of 28 slices shown (4 of 4)]
[im 1/28]
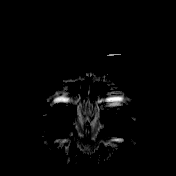
[im 28/28]
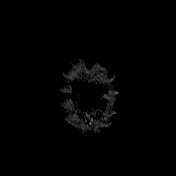

[Series 14: T2 · coronal · 5.0mm · 0.86mm/px · 2 of 27 slices shown (3 of 3)]
[im 1/27]
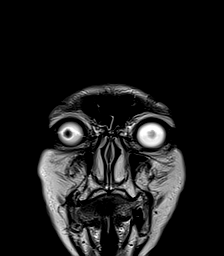
[im 27/27]
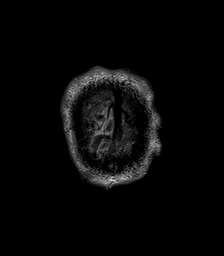

[Series 15: T1 · axial · 1.0mm · 0.94mm/px · z∈[-103,+55]mm · 12 of 160 slices shown (2 of 2)]
[im 1/160]
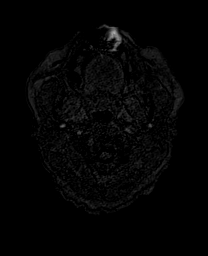
[im 15/160]
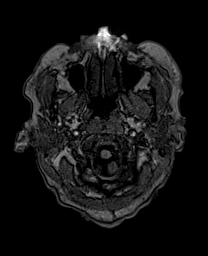
[im 29/160]
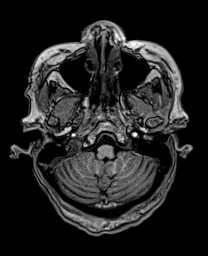
[im 44/160]
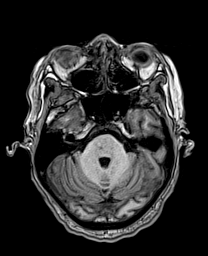
[im 58/160]
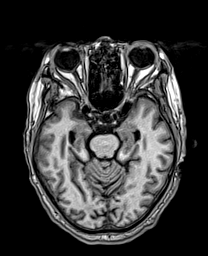
[im 73/160]
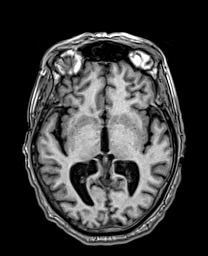
[im 87/160]
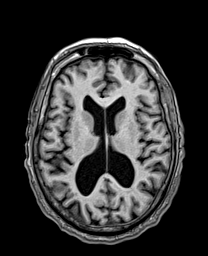
[im 102/160]
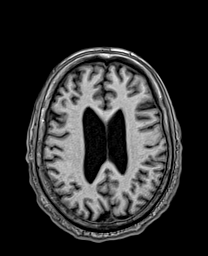
[im 116/160]
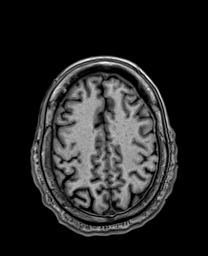
[im 131/160]
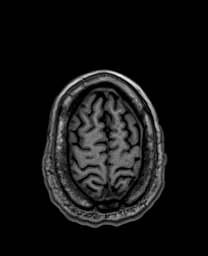
[im 145/160]
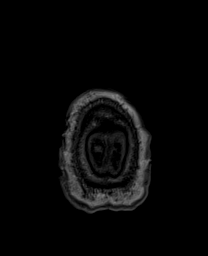
[im 160/160]
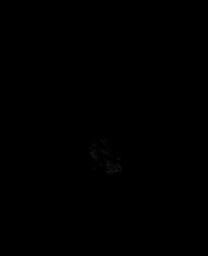

[48 of 48 positions shown; findings below may reference images not displayed]

FINDINGS: Brain: There is no evidence of an acute infarct, intracranial
hemorrhage, mass, midline shift, or extra-axial fluid collection.
Small T2 hyperintensities in the cerebral white matter bilaterally
are unchanged from the prior MRI and are nonspecific but compatible
with mild chronic small vessel ischemic disease. There is mild
cerebral atrophy.

Vascular: Major intracranial vascular flow voids are preserved.

Skull and upper cervical spine: Unremarkable bone marrow signal.

Sinuses/Orbits: Unremarkable orbits. Paranasal sinuses and mastoid
air cells are clear.

Other: None.
IMPRESSION: 1. No acute intracranial abnormality.
2. Mild chronic small vessel ischemic disease.

## 2021-03-22 MED ORDER — LORAZEPAM 2 MG/ML IJ SOLN: 1.0000 mg | Freq: Once | INTRAMUSCULAR | Status: DC

## 2021-03-22 MED ORDER — LORAZEPAM 2 MG/ML IJ SOLN: 0.5000 mg | Freq: Once | INTRAMUSCULAR | Status: DC

## 2021-03-22 MED ORDER — LORAZEPAM 0.5 MG PO TABS: 0.5000 mg | ORAL_TABLET | Freq: Once | ORAL | Status: DC

## 2021-03-22 MED ORDER — LORAZEPAM 2 MG/ML IJ SOLN
1.0000 mg | Freq: Once | INTRAMUSCULAR | Status: DC
  Filled 2021-03-22: qty 1

## 2021-03-22 MED ORDER — GADOBUTROL 1 MMOL/ML IV SOLN
7.5000 mL | Freq: Once | INTRAVENOUS | Status: AC | PRN
  Administered 2021-03-22: 7.5 mL via INTRAVENOUS

## 2021-03-22 NOTE — ED Notes (Signed)
Pt wanted to try using bathroom before in and out cath. RN was informed

## 2021-03-22 NOTE — ED Provider Notes (Signed)
I provided a substantive portion of the care of this patient.  I personally performed the entirety of the medical decision making for this encounter.  EKG Interpretation  Date/Time:  Sunday March 22 2021 12:24:35 EST Ventricular Rate:  79 PR Interval:  168 QRS Duration: 92 QT Interval:  359 QTC Calculation: 412 R Axis:   54 Text Interpretation: Sinus rhythm Nonspecific T abnormalities, inferior leads Minimal ST elevation, anterior leads Confirmed by Lorre Nick ([id number]) on 03/22/2021 12:96:82 PM   61 year old female presents with onset of left hand and wrist weakness and paresthesias that began today.  States that it starts at her left forearm and goes down to her left hand.  No other neurological findings.  On exam patient has trouble with dorsiflexion of her wrist.  Fingers appear to be somewhat contracted.  Sensation is intact however.  Patient has no neck pain at this time but will order MRI of brain and cervical spine.  Suspect peripheral nerve issue due to history of neuropathy.   Lorre Nick, MD 03/22/21 1515

## 2021-03-22 NOTE — ED Notes (Signed)
Patient transported to CT 

## 2021-03-22 NOTE — ED Triage Notes (Signed)
Patient and patient's daughter report that the patient woke from her sleep c/o left hand and left forearm numbness and tingling. Patient's daughter also reports that the patient has been waking up out of her sleep being confused x 1 week and states that at times she c/o pressure when she urinates. Daughter denies any fever and states she normally urinates a lot because she drinks a lot of water.

## 2021-03-22 NOTE — Discharge Instructions (Signed)
Follow-up with neurosurgery.  Their contact information is listed on your discharge paperwork.  Return for new or worsening symptoms.

## 2021-03-22 NOTE — ED Notes (Signed)
MRI called by Clydie Braun

## 2021-03-22 NOTE — ED Provider Notes (Signed)
[facility]-EMERGENCY DEPT Provider Note   CSN: [id number] Arrival date & time: 03/22/21  1114     History  Chief Complaint  Patient presents with   Numbness    Brandi Hernandez is a 61 y.o. female.  HPI  Patient presents with left arm numbness and tingling.  She states it feels like neuropathy.  She woke up with this sensation this morning, went to bed last night around midnight.  Not having any chest pain or shortness of breath, has had pain like this before and typically has been similar to the neuropathy in her feet.  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  Does endorse pressure with urination but denies any dysuria.  Per the daughter there is increased urinary frequency.  [place] per the daughter patient started having memory issues around that time.  She is being worked up for dementia, no formal diagnosis yet however patient is having symptoms of hallucinations, confusion, paranoid delusions intermittently.  In the past sometimes this is related to a UTI, sometimes is just her baseline.  There seems to be no obvious provoking features to it.   Shx: Left BKA  PMH Peripheral neuropathy Hypercholesterolemia Memory deficit Deep vein thrombosis (DVT) (HCC) Hypertension Type 2 diabetes, on insulin  Home Medications Prior to Admission medications   Not on File      Allergies    Patient has no known allergies.    Review of Systems   Review of Systems  Constitutional:  Negative for fever.  HENT:  Negative for congestion.   Respiratory:  Negative for shortness of breath.   Cardiovascular:  Negative for chest pain.  Gastrointestinal:  Negative for nausea and vomiting.  Musculoskeletal:  Negative for back pain.  Skin:  Negative for wound.  Neurological:  Positive for numbness. Negative for syncope and weakness.  Psychiatric/Behavioral:  Positive for confusion.    Physical Exam Updated Vital Signs BP 127/85 (BP  Location: Right Arm)    Pulse 88    Temp 97.7 F (36.5 C)    Resp 16    Ht 5\' 9"  (1.753 m)    Wt 74.4 kg    SpO2 98%    BMI 24.22 kg/m  Physical Exam Vitals and nursing note reviewed. Exam conducted with a chaperone present.  Constitutional:      Appearance: Normal appearance.  HENT:     Head: Normocephalic and atraumatic.  Eyes:     General: No scleral icterus.       Right eye: No discharge.        Left eye: No discharge.     Extraocular Movements: Extraocular movements intact.     Pupils: Pupils are equal, round, and reactive to light.  Cardiovascular:     Rate and Rhythm: Normal rate and regular rhythm.     Pulses: Normal pulses.     Heart sounds: Normal heart sounds. No murmur heard.   No friction rub. No gallop.  Pulmonary:     Effort: Pulmonary effort is normal. No respiratory distress.     Breath sounds: Normal breath sounds.  Abdominal:     General: Abdomen is flat. Bowel sounds are normal. There is no distension.     Palpations: Abdomen is soft.     Tenderness: There is no abdominal tenderness.  Musculoskeletal:     Right lower leg: No edema.     Left lower leg: No edema.     Comments: Contracted  left hand  Skin:    General: Skin is warm and dry.     Coloration: Skin is not jaundiced.  Neurological:     Mental Status: She is alert. Mental status is at baseline.     Coordination: Coordination normal.     Comments: Cranial nerves III through XII are grossly intact.  No dysarthria, patient oriented to place, year, month, day. Patient does have contracted left hand. Wrist drop noted.    ED Results / Procedures / Treatments   Labs (all labs ordered are listed, but only abnormal results are displayed) Labs Reviewed  URINE CULTURE  CBC WITH DIFFERENTIAL/PLATELET  COMPREHENSIVE METABOLIC PANEL  URINALYSIS, ROUTINE W REFLEX MICROSCOPIC  TROPONIN I (HIGH SENSITIVITY)    EKG None  Radiology No results found.  Procedures Procedures    Medications Ordered in  ED Medications - No data to display  ED Course/ Medical Decision Making/ A&P                           Medical Decision Making This is a 61 year old female with history as documented in the HPI.  She does have history of type 2 diabetes is on insulin, has not had her insulin today.    Patient does have left-sided wrist drop, her left hand is contracted.  She does have a history of arthritis, states that the contraction is new.  She did wake up for is not an acute stroke.  Paresthesias are noted to the hand, the numbness is only from the elbow to the hand so this is likely radiculopathy.  Given the acute onset of we will proceed with MRI of brain and neck.    Cap refill less than 2, radial pulses 2+.  Do not suspect this is an acute vascular issue.  Additionally, there is no specific point tenderness and no acute injury.  For that reason I do not really suspect this is a fracture or dislocation.  Additionally not febrile and able to tolerate range of motion so not concerned for septic joint. Given a troponin was ordered.  Initial troponin is negative, I do not really suspect this is ACS.  Regarding the altered mental status.  This has been intermittent and ongoing for the last 6 months.  She is mentating well and fully alert and oriented.  CT head negative, MRI brain and neck pending at shift change.  Patient case discussed with Brittni Henderly who is taking over care at shift change.  Patient is pending MRI brain and neck, also pending urine.  Disposition will depend on these findings.  I suspect her intermittent mental status changes for the last 6 months are not an acute process and may be related to an underlying dementia.  This should be worked up outpatient additionally, I suspect the patient has a radiculopathy or neuropathy, if the MRI brain and neck is negative I suspect she will be appropriate for discharge home.   Amount and/or Complexity of Data Reviewed Independent Historian:  guardian    Details: Patient daughter at bedside External Data Reviewed: notes. Labs: ordered. Decision-making details documented in ED Course. Radiology: ordered. Decision-making details documented in ED Course. ECG/medicine tests: ordered.    Details: NSR  Risk OTC drugs. Prescription drug management. Decision regarding hospitalization. Diagnosis or treatment significantly limited by social determinants of health.   Discussed HPI, physical exam and plan of care for this patient with attending Dr. Lorre Nick. The attending physician evaluated this  patient as part of a shared visit and agrees with plan of care.          Final Clinical Impression(s) / ED Diagnoses Final diagnoses:  None    Rx / DC Orders ED Discharge Orders     None         Theron Arista, Cordelia Poche 03/22/21 1531    Lorre Nick, MD 03/25/21 1310

## 2021-03-22 NOTE — ED Notes (Signed)
Patient transported to MRI 

## 2021-03-22 NOTE — ED Provider Notes (Signed)
Care assumed from previous provider at shift change. See note for full HPI. Here with Left hand, forearm numbness, found ot have wrist drop,. Some mild dysuria  FU on MR brain, cervical and urine  Physical Exam  BP (!) 166/71 (BP Location: Right Arm)    Pulse 88    Temp 97.7 F (36.5 C)    Resp 18    Ht 5\' 9"  (1.753 m)    Wt 74.4 kg    SpO2 99%    BMI 24.22 kg/m   Physical Exam Vitals and nursing note reviewed.  Constitutional:      General: She is not in acute distress.    Appearance: She is well-developed. She is not ill-appearing, toxic-appearing or diaphoretic.  HENT:     Head: Atraumatic.  Eyes:     Pupils: Pupils are equal, round, and reactive to light.  Cardiovascular:     Rate and Rhythm: Normal rate.     Pulses: Normal pulses.          Radial pulses are 2+ on the right side and 2+ on the left side.     Heart sounds: Normal heart sounds.  Pulmonary:     Effort: No respiratory distress.  Abdominal:     General: There is no distension.  Musculoskeletal:        General: Normal range of motion.     Cervical back: Normal range of motion.     Comments: LE BKA, No midline tenderness Left hand wrist drop Compartments soft  Skin:    General: Skin is warm and dry.     Capillary Refill: Capillary refill takes less than 2 seconds.     Comments: No rashes or lesions  Neurological:     General: No focal deficit present.     Mental Status: She is alert and oriented to person, place, and time.     Cranial Nerves: No cranial nerve deficit.     Sensory: Sensory deficit present.     Motor: Weakness present.     Comments: Altered sensation to left humerus and forearm. Wrist drop to left wrist. No midline tenderness to cervical region, LE.  Psychiatric:        Mood and Affect: Mood normal.    ED Course/Procedures     Procedures Labs Reviewed  CBC WITH DIFFERENTIAL/PLATELET - Abnormal; Notable for the following components:      Result Value   Hemoglobin 9.6 (*)    HCT 29.5  (*)    MCV 68.0 (*)    MCH 22.1 (*)    RDW 19.3 (*)    All other components within normal limits  COMPREHENSIVE METABOLIC PANEL - Abnormal; Notable for the following components:   Glucose, Bld 320 (*)    BUN 25 (*)    Creatinine, Ser 1.02 (*)    Albumin 3.1 (*)    All other components within normal limits  URINALYSIS, ROUTINE W REFLEX MICROSCOPIC - Abnormal; Notable for the following components:   APPearance HAZY (*)    Glucose, UA >=500 (*)    Protein, ur >=300 (*)    Bacteria, UA RARE (*)    All other components within normal limits  CBG MONITORING, ED - Abnormal; Notable for the following components:   Glucose-Capillary 283 (*)    All other components within normal limits  URINE CULTURE  TROPONIN I (HIGH SENSITIVITY)   DG Chest 2 View  Result Date: 03/22/2021 CLINICAL DATA:  61 year old female with history of weakness. EXAM: CHEST -  2 VIEW COMPARISON:  No priors. FINDINGS: Lung volumes are normal. No consolidative airspace disease. No pleural effusions. No pneumothorax. No pulmonary nodule or mass noted. Pulmonary vasculature and the cardiomediastinal silhouette are within normal limits. IMPRESSION: No radiographic evidence of acute cardiopulmonary disease. Electronically Signed   By: Trudie Reed M.D.   On: 03/22/2021 13:07   CT Head Wo Contrast  Result Date: 03/22/2021 CLINICAL DATA:  Mental status change of unknown cause. EXAM: CT HEAD WITHOUT CONTRAST TECHNIQUE: Contiguous axial images were obtained from the base of the skull through the vertex without intravenous contrast. COMPARISON:  Brain MRI, 10/30/2020. FINDINGS: Brain: No evidence of acute infarction, hemorrhage, hydrocephalus, extra-axial collection or mass lesion/mass effect. Vascular: No hyperdense vessel or unexpected calcification. Skull: Normal. Negative for fracture or focal lesion. Sinuses/Orbits: Globes and orbits are unremarkable. Sinuses are clear. Other: None. IMPRESSION: No acute intracranial abnormalities.  Electronically Signed   By: Amie Portland M.D.   On: 03/22/2021 12:55   MR Brain Wo Contrast (neuro protocol)  Result Date: 03/22/2021 CLINICAL DATA:  TIA. Left upper extremity weakness and paresthesias. EXAM: MRI HEAD WITHOUT CONTRAST TECHNIQUE: Multiplanar, multiecho pulse sequences of the brain and surrounding structures were obtained without intravenous contrast. COMPARISON:  Head CT 03/22/2021 and MRI 10/30/2020 FINDINGS: Brain: There is no evidence of an acute infarct, intracranial hemorrhage, mass, midline shift, or extra-axial fluid collection. Small T2 hyperintensities in the cerebral white matter bilaterally are unchanged from the prior MRI and are nonspecific but compatible with mild chronic small vessel ischemic disease. There is mild cerebral atrophy. Vascular: Major intracranial vascular flow voids are preserved. Skull and upper cervical spine: Unremarkable bone marrow signal. Sinuses/Orbits: Unremarkable orbits. Paranasal sinuses and mastoid air cells are clear. Other: None. IMPRESSION: 1. No acute intracranial abnormality. 2. Mild chronic small vessel ischemic disease. Electronically Signed   By: Sebastian Ache M.D.   On: 03/22/2021 18:12   MR Cervical Spine W or Wo Contrast  Result Date: 03/22/2021 CLINICAL DATA:  Wrist drop. Left upper extremity weakness and paresthesias. EXAM: MRI CERVICAL SPINE WITHOUT AND WITH CONTRAST TECHNIQUE: Multiplanar and multiecho pulse sequences of the cervical spine, to include the craniocervical junction and cervicothoracic junction, were obtained without and with intravenous contrast. CONTRAST:  7.35mL GADAVIST GADOBUTROL 1 MMOL/ML IV SOLN COMPARISON:  None. FINDINGS: Alignment: Cervical spine straightening.  No listhesis. Vertebrae: No fracture or suspicious marrow lesion. Degenerative endplate changes at C4-5 and C6-7 including mild edema with associated moderate disc space narrowing. Milder disc space narrowing and degenerative endplate changes at C3-4. Cord:  Normal signal.  No abnormal intradural enhancement. Posterior Fossa, vertebral arteries, paraspinal tissues: Unremarkable. Disc levels: C2-3: Negative. C3-4: Disc bulging and uncovertebral spurring result in moderate to severe spinal stenosis with mild to moderate cord flattening and mild left neural foraminal stenosis. C4-5: Broad-based posterior disc osteophyte complex results in severe spinal stenosis with moderate cord flattening and moderate left greater than right neural foraminal stenosis. C5-6: Disc bulging and and symmetric right uncovertebral spurring result in mild spinal stenosis and mild-to-moderate right neural foraminal stenosis. C6-7: Disc bulging, a right paracentral to foraminal disc protrusion, and and uncovertebral spurring result in mild spinal stenosis and moderate right neural foraminal stenosis. C7-T1: Negative. IMPRESSION: 1. Multilevel cervical disc degeneration, worst at C4-5 where there is severe spinal stenosis and moderate neural foraminal stenosis. 2. Moderate to severe spinal stenosis at C3-4. 3. Mild spinal stenosis and moderate right neural foraminal stenosis at C6-7. Electronically Signed   By: Jolaine Click.D.  On: 03/22/2021 18:18    MDM  FU on UA, MR  Labs and imaging personally reviewed and interpreted> MR brain without acute findings  MR cervical with severe stenosis  UA neg for infection  Patient reassessed. Discussed labs and imaging. Will plan on consult with Neurosurgery for rec.  CONSULT with Dr. Maisie Fushomas with Neurosurgery who recommends outpatient FU. Strict return precautions   I discussed this with daughter and patient in room.  They are agreeable.  Low suspicion for acute orthopedic, neurologic, neurosurgical need for inpatient management.  Agreeable for outpatient follow-up, return for new or worsening symptoms.  No evidence of CVA on exam.  No trauma to shoulder, humerus, head to suggest wrist drop from traumatic injury.  Compartments are soft.  No  clinical evidence of VTE on exam.  No midline spinal tenderness, full range of motion. No skin changes to suggest infectious process.   The patient has been appropriately medically screened and/or stabilized in the ED. I have low suspicion for any other emergent medical condition which would require further screening, evaluation or treatment in the ED or require inpatient management.  Patient is hemodynamically stable and in no acute distress.  Patient able to ambulate in department prior to ED.  Evaluation does not show acute pathology that would require ongoing or additional emergent interventions while in the emergency department or further inpatient treatment.  I have discussed the diagnosis with the patient and answered all questions.  Pain is been managed while in the emergency department and patient has no further complaints prior to discharge.  Patient is comfortable with plan discussed in room and is stable for discharge at this time.  I have discussed strict return precautions for returning to the emergency department.  Patient was encouraged to follow-up with PCP/specialist refer to at discharge.        Airelle Everding A, PA-C 03/22/21 1950    Pricilla LovelessGoldston, Scott, MD 03/22/21 2115

## 2021-03-23 LAB — URINE CULTURE: Culture: NO GROWTH

## 2021-04-02 ENCOUNTER — Ambulatory Visit: Payer: Medicare HMO | Admitting: Orthopedic Surgery

## 2021-04-17 ENCOUNTER — Other Ambulatory Visit: Payer: Self-pay

## 2021-04-17 ENCOUNTER — Ambulatory Visit (INDEPENDENT_AMBULATORY_CARE_PROVIDER_SITE_OTHER): Payer: Medicare HMO | Admitting: Family

## 2021-04-17 ENCOUNTER — Encounter: Payer: Self-pay | Admitting: Family

## 2021-04-17 DIAGNOSIS — Z89512 Acquired absence of left leg below knee: Secondary | ICD-10-CM

## 2021-04-17 DIAGNOSIS — S88112A Complete traumatic amputation at level between knee and ankle, left lower leg, initial encounter: Secondary | ICD-10-CM

## 2021-04-17 NOTE — Progress Notes (Signed)
Office Visit Note   Patient: Brandi Hernandez           Date of Birth: [birth year]           MRN: [id number] Visit Date: 04/17/2021              Requested by: No referring provider defined for this encounter. PCP: Pcp, No  No chief complaint on file.     HPI: Patient is a 61 year old woman who today in follow-up.  She is status post left below-knee and has not yet received her prosthesis.  She will be working with restore.  She has a fitting scheduled for February 3 she also has physical therapy ordered and set up  She and her family are eager for her to begin  Assessment & Plan: Visit Diagnoses:  1. Below-knee amputation of left lower extremity ([place])     Plan: Given an order for her prosthesis set up.  She will be a K2 level ambulator given an order she will continue wearing her shrinker around-the-clock  Follow-Up Instructions: Return if symptoms worsen or fail to improve.   Ortho Exam  Patient is alert, oriented, no adenopathy, well-dressed, normal affect, normal respiratory effort. On examination of the left residual limb this is well consolidated well-healed there is no erythema no callus  Imaging: No results found. No images are attached to the encounter.  Labs: Lab Results  Component Value Date   REPTSTATUS 03/23/2021 FINAL 03/22/2021   CULT  03/22/2021    NO GROWTH Performed at [facility], [place] [address], [place], [place] [address]      Lab Results  Component Value Date   ALBUMIN 3.1 (L) 03/22/2021    No results found for: MG No results found for: VD25OH  No results found for: PREALBUMIN CBC EXTENDED Latest Ref Rng & Units 03/22/2021  WBC 4.0 - 10.5 K/uL 8.4  RBC 3.87 - 5.11 MIL/uL 4.34  HGB 12.0 - 15.0 g/dL 9.6(L)  HCT 36.0 - 46.0 % 29.5(L)  PLT 150 - 400 K/uL 318  NEUTROABS 1.7 - 7.7 K/uL 4.9  LYMPHSABS 0.7 - 4.0 K/uL 2.3     There is no height or weight on file to calculate BMI.  Orders:  No orders of the defined types were placed  in this encounter.  No orders of the defined types were placed in this encounter.    Procedures: No procedures performed  Clinical Data: No additional findings.  ROS:  All other systems negative, except as noted in the HPI. Review of Systems  Objective: Vital Signs: There were no vitals taken for this visit.  Specialty Comments:  No specialty comments available.  PMFS History: Patient Active Problem List   Diagnosis Date Noted   Peripheral neuropathy 10/19/2020   Hypercholesterolemia 10/19/2020   Memory deficit 10/19/2020   Deep vein thrombosis (DVT) ([place]) 10/19/2020   Past Medical History:  Diagnosis Date   Deep vein thrombosis (DVT) (HCC)    Diabetes mellitus without complication ([place])    Hypercholesterolemia    Hypertension    Memory deficit    Peripheral neuropathy    Renal disorder     History reviewed. No pertinent family history.  Past Surgical History:  Procedure Laterality Date   LEG AMPUTATION Left    Below the knee   TOE AMPUTATION Right    Social History   Occupational History   Not on file  Tobacco Use   Smoking status: Never   Smokeless tobacco: Never  Vaping Use   Vaping Use: Never used  Substance and Sexual Activity   Alcohol use: Yes   Drug use: Never   Sexual activity: Not on file

## 2021-08-10 ENCOUNTER — Ambulatory Visit: Payer: Medicare HMO | Admitting: Neurology

## 2021-09-03 ENCOUNTER — Ambulatory Visit: Payer: Medicare HMO | Admitting: Neurology

## 2021-09-03 ENCOUNTER — Encounter: Payer: Self-pay | Admitting: Neurology

## 2021-09-03 VITALS — BP 176/94 | HR 82

## 2021-09-03 DIAGNOSIS — F32A Depression, unspecified: Secondary | ICD-10-CM | POA: Diagnosis not present

## 2021-09-03 DIAGNOSIS — R440 Auditory hallucinations: Secondary | ICD-10-CM

## 2021-09-03 DIAGNOSIS — G3184 Mild cognitive impairment, so stated: Secondary | ICD-10-CM | POA: Diagnosis not present

## 2021-09-03 MED ORDER — NOVOLIN 70/30 FLEXPEN (70-30) 100 UNIT/ML ~~LOC~~ SUPN: PEN_INJECTOR | SUBCUTANEOUS | 6 refills | Status: AC

## 2021-09-03 MED ORDER — LISINOPRIL 10 MG PO TABS: 10.0000 mg | ORAL_TABLET | Freq: Every day | ORAL | 6 refills | Status: DC

## 2021-09-03 MED ORDER — BUPROPION HCL ER (XL) 150 MG PO TB24: 150.0000 mg | ORAL_TABLET | Freq: Every day | ORAL | 6 refills | Status: AC

## 2021-09-03 MED ORDER — IRON 325 (65 FE) MG PO TABS: 325.0000 mg | ORAL_TABLET | Freq: Every day | ORAL | 3 refills | Status: DC

## 2021-09-03 MED ORDER — AMLODIPINE BESYLATE 5 MG PO TABS: 5.0000 mg | ORAL_TABLET | Freq: Every day | ORAL | 6 refills | Status: AC

## 2021-09-03 MED ORDER — GABAPENTIN 100 MG PO CAPS: 100.0000 mg | ORAL_CAPSULE | Freq: Three times a day (TID) | ORAL | 6 refills | Status: AC

## 2021-09-03 MED ORDER — ATORVASTATIN CALCIUM 40 MG PO TABS: 40.0000 mg | ORAL_TABLET | Freq: Every day | ORAL | 6 refills | Status: AC

## 2021-09-03 MED ORDER — ASPIRIN 81 MG PO TBEC: 81.0000 mg | DELAYED_RELEASE_TABLET | Freq: Every day | ORAL | 6 refills | Status: AC

## 2021-09-03 NOTE — Patient Instructions (Addendum)
Restart all medication including Wellbutrin 150 mg daily We will obtain B12 level and TSH today Follow-up in 46-month, at that time we will repeat Moca Continue to follow with your primary care doctor

## 2021-09-03 NOTE — Progress Notes (Signed)
[facility]  PATIENT: Brandi Hernandez DOB: December 08, 1960  REQUESTING CLINICIAN: Ceasar Lund, PA HISTORY FROM: Patient but mostly from Daughter [name] REASON FOR VISIT: Worsening memory/Hallucinations    HISTORICAL  CHIEF COMPLAINT:  Chief Complaint  Patient presents with   New Patient (Initial Visit)    Rm 17 with daughter [name])- Moved from [place] a year ago and since being with her daughter she will have moments of hallucinations, unable to complete normal tasks at home. She has had several amputations between 2019-2022. 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  Daughter states that prior to COVID patient was very independent, worked and lived in [place] but since 2020 she had had amputation of bilateral toes, worsening infection and in Feb 2022 she had a left below the knee amputation.  Since the amputation daughter state that her mood and memory have markedly declined.  At one point her doctor in [place] started her on Wellbutrin but currently she is not taking it, daughter stated for the past year patient has not been taking her medications.  Currently she is reporting auditory hallucinations,  sometimes she can hear her grandson. She is not taking any of her medications and daughter believe that she cannot function on her own, she is very confused, she is very withdrawn, does not talk too much to the family when she was a very jovial and outgoing person.     TBI:   No past history of TBI Stroke:   no past history of stroke Seizures:   no past history of seizures Sleep:   no history of sleep apnea.   Mood: Reports anxiety  and depression  Functional status: Dependent in all  ADLs and IADLs Patient lives with daughter Cooking: no Cleaning: no  Shopping: no  Bathing: patient  Toileting: patient  Driving: no  Bills: daughter  Medications: not taking her meds Ever left the stove on by accident?: n/A  Forget how to use items around the house?: no  Getting lost going to familiar places?: not from [place]  Forgetting loved ones names?: no  Word finding difficulty? Yes  Sleep: Sometimes sleep good, sometimes not   OTHER MEDICAL CONDITIONS: Diabetes mellitus, hypertension, hyperlipidemia, below-knee amputation   REVIEW OF SYSTEMS: Full 14 system review of systems performed and negative with exception of: Unable to fully obtain  ALLERGIES: No Known Allergies  HOME MEDICATIONS: Outpatient Medications Prior to Visit  Medication Sig Dispense Refill   amLODipine (NORVASC) 5 MG tablet Take 5 mg by mouth daily.     ASPIRIN 81 PO Take by mouth.     atorvastatin (LIPITOR) 40 MG tablet Take 40 mg by mouth daily.     buPROPion HCl (WELLBUTRIN PO) Take 150 mg by mouth.     Ferrous Sulfate (IRON PO) Take by mouth.     Gabapentin (GABARONE PO) Take 100 mg by mouth in the morning, at noon, and at bedtime.     Insulin NPH Isophane & Regular (NOVOLIN 70/30 FLEXPEN [place]) Inject into the skin. 20 in the am and 25 at night     lisinopril (ZESTRIL) 10 MG tablet Take 10 mg by mouth  daily.     No facility-administered medications prior to visit.    PAST MEDICAL HISTORY: Past Medical History:  Diagnosis Date   Deep vein thrombosis (DVT) (HCC)    Diabetes mellitus without complication (HCC)    Hypercholesterolemia    Hypertension    Memory deficit    Peripheral neuropathy    Renal disorder     PAST SURGICAL HISTORY: Past Surgical History:  Procedure Laterality Date   LEG AMPUTATION Left    Below the knee   TOE AMPUTATION Right     FAMILY HISTORY: History reviewed. No pertinent family history.  SOCIAL  HISTORY: Social History   Socioeconomic History   Marital status: Single    Spouse name: Not on file   Number of children: Not on file   Years of education: Not on file   Highest education level: Not on file  Occupational History   Not on file  Tobacco Use   Smoking status: Never   Smokeless tobacco: Never  Vaping Use   Vaping Use: Never used  Substance and Sexual Activity   Alcohol use: Yes   Drug use: Never   Sexual activity: Not on file  Other Topics Concern   Not on file  Social History Narrative   Not on file   Social Determinants of Health   Financial Resource Strain: Not on file  Food Insecurity: Not on file  Transportation Needs: Not on file  Physical Activity: Not on file  Stress: Not on file  Social Connections: Not on file  Intimate Partner Violence: Not on file    PHYSICAL EXAM  GENERAL EXAM/CONSTITUTIONAL: Vitals:  Vitals:   09/03/21 1053  BP: (!) 176/94  Pulse: 82   There is no height or weight on file to calculate BMI. Wt Readings from Last 3 Encounters:  03/22/21 164 lb (74.4 kg)  10/19/20 164 lb (74.4 kg)   Patient is in no distress; well developed, nourished and groomed; neck is supple   EYES: Pupils round and reactive to light, Visual fields full to confrontation, Extraocular movements intacts,   MUSCULOSKELETAL: Gait, strength, tone, movements noted in Neurologic exam below  NEUROLOGIC: MENTAL STATUS:      No data to display            09/03/2021   11:56 AM  Montreal Cognitive Assessment   Visuospatial/ Executive (0/5) 1  Naming (0/3) 2  Attention: Read list of digits (0/2) 2  Attention: Read list of letters (0/1) 0  Attention: Serial 7 subtraction starting at 100 (0/3) 1  Language: Repeat phrase (0/2) 0  Language : Fluency (0/1) 0  Abstraction (0/2) 0  Delayed Recall (0/5) 3  Orientation (0/6) 6  Total 15  Adjusted Score (based on education) 15     CRANIAL NERVE:  2nd, 3rd, 4th, 6th - pupils equal and reactive  to light, visual fields full to confrontation, extraocular muscles intact, no nystagmus 5th - facial sensation symmetric 7th - facial strength symmetric 8th - hearing intact 9th - palate elevates symmetrically, uvula midline 11th - shoulder shrug symmetric 12th - tongue protrusion midline  MOTOR:  normal bulk and tone, full strength in the BUE, BLE, she has a left BKA  SENSORY:  normal and symmetric to light touch  COORDINATION:  finger-nose-finger, fine finger movements normal  REFLEXES:  deep tendon reflexes present and symmetric  GAIT/STATION:  Deferred   DIAGNOSTIC DATA (LABS, IMAGING, TESTING) - I reviewed patient records, labs, notes, testing and imaging myself where available.  Lab Results  Component Value Date   WBC 8.4 03/22/2021   HGB 9.6 (L) 03/22/2021   HCT 29.5 (L) 03/22/2021   MCV 68.0 (L) 03/22/2021   PLT 318 03/22/2021      Component Value Date/Time   NA 136 03/22/2021 1213   K 4.5 03/22/2021 1213   CL 103 03/22/2021 1213   CO2 27 03/22/2021 1213   GLUCOSE 320 (H) 03/22/2021 1213   BUN 25 (H) 03/22/2021 1213   CREATININE 1.02 (H) 03/22/2021 1213   CALCIUM 8.9 03/22/2021 1213   PROT 8.1 03/22/2021 1213   ALBUMIN 3.1 (L) 03/22/2021 1213   AST 21 03/22/2021 1213   ALT 16 03/22/2021 1213   ALKPHOS 89 03/22/2021 1213   BILITOT 0.4 03/22/2021 1213   GFRNONAA >60 03/22/2021 1213   No results found for: "CHOL", "HDL", "LDLCALC", "LDLDIRECT", "TRIG", "CHOLHDL" No results found for: "HGBA1C" No results found for: "VITAMINB12" No results found for: "TSH"   MRI Brain 03/22/21 1. No acute intracranial abnormality. 2. Mild chronic small vessel ischemic disease.   ASSESSMENT AND PLAN  61 y.o. year old female with history of depression, diabetes mellitus, hypertension and hyperlipidemia, left BKA who is presenting with worsening memory, lack of motivation and forgetfulness.  On exam patient was noted to be withdrawn, flat affect, poor eye contact.  She  scored a 15 out of 30 on the MoCA indicative of his moderate impairment.  At this time I informed patient and daughter that it is likely that patient memory problem is causing the depression.  After discussing at length with patient, she agrees to restart all her medication including Wellbutrin.  I will see her in 3 months for follow-up, at that time I might consider increasing the Wellbutrin to 300 mg daily.  We will also recheck the Moca.  They are comfortable with plan.  Today I will check a TSH and B12 level.  Advised him to continue following up with her primary care doctor and return sooner if needed.   1. Mild cognitive impairment   2. Depression, unspecified depression type   3. Auditory hallucination      Patient Instructions  Restart all medication including Wellbutrin 150 mg daily We will obtain B12 level and TSH today Follow-up in 19-month, at that time we will repeat Moca Continue to follow with your primary care doctor  Orders Placed This Encounter  Procedures   TSH   Vitamin B12   Ambulatory referral to Neuropsychology    Meds ordered this encounter  Medications   amLODipine (NORVASC) 5 MG tablet    Sig: Take 1 tablet (5 mg total) by mouth daily.    Dispense:  30 tablet    Refill:  6   aspirin EC (ASPIRIN 81) 81 MG tablet    Sig: Take 1 tablet (81 mg total) by mouth daily.    Dispense:  30 tablet    Refill:  6   atorvastatin (LIPITOR) 40 MG tablet    Sig: Take 1 tablet (40 mg total) by mouth daily.    Dispense:  30 tablet    Refill:  6   buPROPion (WELLBUTRIN XL) 150 MG 24 hr tablet    Sig: Take 1 tablet (150 mg total) by mouth daily.    Dispense:  30 tablet    Refill:  6   gabapentin (NEURONTIN) 100 MG capsule    Sig: Take 1 capsule (100 mg total) by mouth 3 (three) times daily.    Dispense:  90 capsule  Refill:  6   lisinopril (ZESTRIL) 10 MG tablet    Sig: Take 1 tablet (10 mg total) by mouth daily.    Dispense:  30 tablet    Refill:  6   insulin  isophane & regular human KwikPen (NOVOLIN 70/30 KWIKPEN) (70-30) 100 UNIT/ML KwikPen    Sig: 20 in the am and 25 at night    Dispense:  3 mL    Refill:  6   Ferrous Sulfate (IRON) 325 (65 Fe) MG TABS    Sig: Take 1 tablet (325 mg total) by mouth daily.    Dispense:  30 tablet    Refill:  3    Return in about 3 months (around 12/04/2021).  I have spent a total of 65 minutes dedicated to this patient today, preparing to see patient, performing a medically appropriate examination and evaluation, ordering tests and/or medications and procedures, and counseling and educating the patient/family/caregiver; independently interpreting result and communicating results to the family/patient/caregiver; and documenting clinical information in the electronic medical record.   Windell Norfolk, MD 09/03/2021, 8:28 PM  [facility] [address], [address] [place], [place] [address] [phone]

## 2021-09-04 LAB — TSH: TSH: 2.99 u[IU]/mL (ref 0.450–4.500)

## 2021-09-04 LAB — VITAMIN B12: Vitamin B-12: 646 pg/mL (ref 232–1245)

## 2021-09-07 ENCOUNTER — Telehealth: Payer: Self-pay | Admitting: Neurology

## 2021-09-07 NOTE — Telephone Encounter (Signed)
Referral for Neuropsychology sent to [facility] [phone]. 

## 2021-09-09 ENCOUNTER — Telehealth: Payer: Self-pay | Admitting: *Deleted

## 2021-09-09 NOTE — Progress Notes (Signed)
Please call and advise the patient that the recent TSH and Vitamin B 12 level were within normal limits. No further action is required on these tests at this time. Please remind patient to keep any upcoming appointments or tests and to call us with any interim questions, concerns, problems or updates. Thanks,   Windell Norfolk, MD

## 2021-09-09 NOTE — Telephone Encounter (Signed)
-----   Message from Windell Norfolk, MD sent at 09/09/2021  9:10 AM EDT ----- Please call and advise the patient that the recent TSH and Vitamin B 12 level were within normal limits. No further action is required on these tests at this time. Please remind patient to keep any upcoming appointments or tests and to call us with any interim questions, concerns, problems or updates. Thanks,   Windell Norfolk, MD

## 2021-09-09 NOTE — Telephone Encounter (Signed)
I spoke to her daughter on [place]. Provided her with the lab results.

## 2021-12-08 ENCOUNTER — Ambulatory Visit: Payer: Medicare HMO | Admitting: Orthopedic Surgery

## 2021-12-10 ENCOUNTER — Ambulatory Visit: Payer: Medicare HMO | Admitting: Neurology

## 2021-12-10 ENCOUNTER — Encounter: Payer: Self-pay | Admitting: Neurology

## 2022-08-08 ENCOUNTER — Emergency Department (HOSPITAL_COMMUNITY)
Admission: EM | Admit: 2022-08-08 | Discharge: 2022-08-08 | Disposition: A | Discharge status: Home / Self Care | Payer: Medicare HMO | Attending: Emergency Medicine | Admitting: Emergency Medicine

## 2022-08-08 ENCOUNTER — Other Ambulatory Visit: Payer: Self-pay

## 2022-08-08 ENCOUNTER — Emergency Department (HOSPITAL_COMMUNITY): Payer: Medicare HMO

## 2022-08-08 DIAGNOSIS — Z79899 Other long term (current) drug therapy: Secondary | ICD-10-CM | POA: Diagnosis not present

## 2022-08-08 DIAGNOSIS — I10 Essential (primary) hypertension: Secondary | ICD-10-CM | POA: Insufficient documentation

## 2022-08-08 DIAGNOSIS — Z89512 Acquired absence of left leg below knee: Secondary | ICD-10-CM | POA: Insufficient documentation

## 2022-08-08 DIAGNOSIS — M7989 Other specified soft tissue disorders: Secondary | ICD-10-CM | POA: Insufficient documentation

## 2022-08-08 DIAGNOSIS — R739 Hyperglycemia, unspecified: Secondary | ICD-10-CM | POA: Diagnosis present

## 2022-08-08 DIAGNOSIS — R2243 Localized swelling, mass and lump, lower limb, bilateral: Secondary | ICD-10-CM | POA: Diagnosis not present

## 2022-08-08 DIAGNOSIS — E1165 Type 2 diabetes mellitus with hyperglycemia: Secondary | ICD-10-CM | POA: Insufficient documentation

## 2022-08-08 DIAGNOSIS — Z794 Long term (current) use of insulin: Secondary | ICD-10-CM | POA: Diagnosis not present

## 2022-08-08 DIAGNOSIS — D649 Anemia, unspecified: Secondary | ICD-10-CM | POA: Diagnosis not present

## 2022-08-08 DIAGNOSIS — N950 Postmenopausal bleeding: Secondary | ICD-10-CM | POA: Diagnosis present

## 2022-08-08 LAB — URINALYSIS, W/ REFLEX TO CULTURE (INFECTION SUSPECTED)
Bilirubin Urine: NEGATIVE
Glucose, UA: >=500 mg/dL — AB
Hgb urine dipstick: NEGATIVE
Ketones, ur: NEGATIVE mg/dL
Nitrite: NEGATIVE
Protein, ur: 100 mg/dL — AB
Specific Gravity, Urine: 1.01 (ref 1.005–1.030)
pH: 6 (ref 5.0–8.0)

## 2022-08-08 LAB — I-STAT VENOUS BLOOD GAS, ED
Acid-Base Excess: 1 mmol/L (ref 0.0–2.0)
Bicarbonate: 26.9 mmol/L (ref 20.0–28.0)
Calcium, Ion: 1.16 mmol/L (ref 1.15–1.40)
HCT: 27 % — ABNORMAL LOW (ref 36.0–46.0)
Hemoglobin: 9.2 g/dL — ABNORMAL LOW (ref 12.0–15.0)
O2 Saturation: 100 %
Potassium: 4.6 mmol/L (ref 3.5–5.1)
Sodium: 137 mmol/L (ref 135–145)
TCO2: 28 mmol/L (ref 22–32)
pCO2, Ven: 46.7 mmHg (ref 44–60)
pH, Ven: 7.368 (ref 7.25–7.43)
pO2, Ven: 186 mmHg — ABNORMAL HIGH (ref 32–45)

## 2022-08-08 LAB — CBC WITH DIFFERENTIAL/PLATELET
Abs Immature Granulocytes: 0.04 10*3/uL (ref 0.00–0.07)
Basophils Absolute: 0.1 10*3/uL (ref 0.0–0.1)
Basophils Relative: 1 %
Eosinophils Absolute: 0.3 10*3/uL (ref 0.0–0.5)
Eosinophils Relative: 3 %
HCT: 26.1 % — ABNORMAL LOW (ref 36.0–46.0)
Hemoglobin: 8.2 g/dL — ABNORMAL LOW (ref 12.0–15.0)
Immature Granulocytes: 0 %
Lymphocytes Relative: 20 %
Lymphs Abs: 2 10*3/uL (ref 0.7–4.0)
MCH: 22.6 pg — ABNORMAL LOW (ref 26.0–34.0)
MCHC: 31.4 g/dL (ref 30.0–36.0)
MCV: 71.9 fL — ABNORMAL LOW (ref 80.0–100.0)
Monocytes Absolute: 0.9 10*3/uL (ref 0.1–1.0)
Monocytes Relative: 9 %
Neutro Abs: 6.8 10*3/uL (ref 1.7–7.7)
Neutrophils Relative %: 67 %
Platelets: 257 10*3/uL (ref 150–400)
RBC: 3.63 MIL/uL — ABNORMAL LOW (ref 3.87–5.11)
RDW: 17.5 % — ABNORMAL HIGH (ref 11.5–15.5)
WBC: 10.1 10*3/uL (ref 4.0–10.5)
nRBC: 0 % (ref 0.0–0.2)

## 2022-08-08 LAB — COMPREHENSIVE METABOLIC PANEL
ALT: 18 U/L (ref 0–44)
AST: 23 U/L (ref 15–41)
Albumin: 2.5 g/dL — ABNORMAL LOW (ref 3.5–5.0)
Alkaline Phosphatase: 92 U/L (ref 38–126)
Anion gap: 11 (ref 5–15)
BUN: 25 mg/dL — ABNORMAL HIGH (ref 8–23)
CO2: 21 mmol/L — ABNORMAL LOW (ref 22–32)
Calcium: 8.3 mg/dL — ABNORMAL LOW (ref 8.9–10.3)
Chloride: 102 mmol/L (ref 98–111)
Creatinine, Ser: 1.38 mg/dL — ABNORMAL HIGH (ref 0.44–1.00)
GFR, Estimated: 43 mL/min — ABNORMAL LOW (ref >60)
Glucose, Bld: 321 mg/dL — ABNORMAL HIGH (ref 70–99)
Potassium: 4.8 mmol/L (ref 3.5–5.1)
Sodium: 134 mmol/L — ABNORMAL LOW (ref 135–145)
Total Bilirubin: 0.6 mg/dL (ref 0.3–1.2)
Total Protein: 6.8 g/dL (ref 6.5–8.1)

## 2022-08-08 LAB — CBG MONITORING, ED: Glucose-Capillary: 290 mg/dL — ABNORMAL HIGH (ref 70–99)

## 2022-08-08 LAB — BRAIN NATRIURETIC PEPTIDE: B Natriuretic Peptide: 75.4 pg/mL (ref 0.0–100.0)

## 2022-08-08 MED ORDER — FERROUS SULFATE 325 (65 FE) MG PO TABS: 325.0000 mg | ORAL_TABLET | Freq: Every day | ORAL | 0 refills | Status: AC

## 2022-08-08 MED ORDER — ESTRADIOL 0.1 MG/GM VA CREA: 1.0000 | TOPICAL_CREAM | Freq: Every day | VAGINAL | 0 refills | Status: DC

## 2022-08-08 MED ORDER — SODIUM CHLORIDE 0.9 % IV BOLUS
500.0000 mL | Freq: Once | INTRAVENOUS | Status: AC
  Administered 2022-08-08: 500 mL via INTRAVENOUS

## 2022-08-08 NOTE — ED Provider Notes (Signed)
[place] EMERGENCY DEPARTMENT AT [facility] Provider Note   CSN: [id number] Arrival date & time: 08/08/22  0831     History  Chief Complaint  Patient presents with   Hyperglycemia   Pruritis   Leg Swelling   Vaginal Bleeding    Brandi Hernandez is a 62 y.o. female.  HPI 62 year old female with a history of hypertension and diabetes presents with leg swelling and some vaginal bleeding.  History is primarily from the daughter.  Patient has been having leg swelling in both legs for about a week.  Seems to be mildly worsening.  No shortness of breath or chest pain.  The patient also started having a little bit of blood after she wiped from her vagina.  There also seems to be a little bit of discharge and so the daughter wonders if she has high glucose.  No rectal bleeding. No vaginal or abdominal pain.  Home Medications Prior to Admission medications   Medication Sig Start Date End Date Taking? Authorizing Provider  estradiol (ESTRACE VAGINAL) 0.1 MG/GM vaginal cream Place 1 Applicatorful vaginally at bedtime. 08/08/22  Yes Pricilla Loveless, MD  ferrous sulfate 325 (65 FE) MG tablet Take 1 tablet (325 mg total) by mouth daily. 08/08/22  Yes Pricilla Loveless, MD  amLODipine (NORVASC) 5 MG tablet Take 1 tablet (5 mg total) by mouth daily. 09/03/21 04/01/22  Windell Norfolk, MD  atorvastatin (LIPITOR) 40 MG tablet Take 1 tablet (40 mg total) by mouth daily. 09/03/21   Windell Norfolk, MD  buPROPion (WELLBUTRIN XL) 150 MG 24 hr tablet Take 1 tablet (150 mg total) by mouth daily. 09/03/21 04/01/22  Windell Norfolk, MD  gabapentin (NEURONTIN) 100 MG capsule Take 1 capsule (100 mg total) by mouth 3 (three) times daily. 09/03/21 04/01/22  Windell Norfolk, MD  insulin isophane & regular human KwikPen (NOVOLIN 70/30 KWIKPEN) (70-30) 100 UNIT/ML KwikPen 20 in the am and 25 at night 09/03/21   Windell Norfolk, MD  lisinopril (ZESTRIL) 10 MG tablet Take 1 tablet (10 mg total) by mouth daily. 09/03/21 04/01/22   Windell Norfolk, MD      Allergies    Patient has no known allergies.    Review of Systems   Review of Systems  Constitutional:  Negative for fever.  Respiratory:  Negative for shortness of breath.   Cardiovascular:  Negative for chest pain.  Gastrointestinal:  Negative for abdominal pain.  Genitourinary:  Positive for vaginal bleeding and vaginal discharge.    Physical Exam Updated Vital Signs BP (!) 159/76   Pulse 84   Temp 97.7 F (36.5 C) (Oral)   Resp 18   Ht 5\' 11"  (1.803 m)   SpO2 100%   BMI 22.87 kg/m  Physical Exam Vitals and nursing note reviewed. Exam conducted with a chaperone present.  Constitutional:      General: She is not in acute distress.    Appearance: She is well-developed. She is not ill-appearing or diaphoretic.  HENT:     Head: Normocephalic and atraumatic.  Cardiovascular:     Rate and Rhythm: Normal rate and regular rhythm.     Heart sounds: Normal heart sounds.  Pulmonary:     Effort: Pulmonary effort is normal.     Breath sounds: Normal breath sounds.  Abdominal:     Palpations: Abdomen is soft.     Tenderness: There is no abdominal tenderness.  Genitourinary:    Comments: Multiple superficial injuries to bilateral vulva. Appears to be from scratches. No vaginal  discharge or rash. No current bleeding. Musculoskeletal:     Comments: Left BKA.  There is some mild, minimal pitting swelling to the right lower leg as well as the left stump.  Skin:    General: Skin is warm and dry.  Neurological:     Mental Status: She is alert.     ED Results / Procedures / Treatments   Labs (all labs ordered are listed, but only abnormal results are displayed) Labs Reviewed  COMPREHENSIVE METABOLIC PANEL - Abnormal; Notable for the following components:      Result Value   Sodium 134 (*)    CO2 21 (*)    Glucose, Bld 321 (*)    BUN 25 (*)    Creatinine, Ser 1.38 (*)    Calcium 8.3 (*)    Albumin 2.5 (*)    GFR, Estimated 43 (*)    All other  components within normal limits  CBC WITH DIFFERENTIAL/PLATELET - Abnormal; Notable for the following components:   RBC 3.63 (*)    Hemoglobin 8.2 (*)    HCT 26.1 (*)    MCV 71.9 (*)    MCH 22.6 (*)    RDW 17.5 (*)    All other components within normal limits  URINALYSIS, W/ REFLEX TO CULTURE (INFECTION SUSPECTED) - Abnormal; Notable for the following components:   Glucose, UA >=500 (*)    Protein, ur 100 (*)    Leukocytes,Ua TRACE (*)    Bacteria, UA RARE (*)    All other components within normal limits  CBG MONITORING, ED - Abnormal; Notable for the following components:   Glucose-Capillary 290 (*)    All other components within normal limits  I-STAT VENOUS BLOOD GAS, ED - Abnormal; Notable for the following components:   pO2, Ven 186 (*)    HCT 27.0 (*)    Hemoglobin 9.2 (*)    All other components within normal limits  BRAIN NATRIURETIC PEPTIDE    EKG EKG Interpretation  Date/Time:  Sunday Aug 08 2022 09:17:10 EDT Ventricular Rate:  86 PR Interval:  171 QRS Duration: 91 QT Interval:  392 QTC Calculation: 469 R Axis:   43 Text Interpretation: Sinus rhythm Borderline T abnormalities, lateral leads  no significant change since Jan 2023 Confirmed by Pricilla Loveless [phone]) on 08/08/2022 9:40:45 AM  Radiology DG Chest Portable 1 View  Result Date: 08/08/2022 CLINICAL DATA:  Leg swelling EXAM: PORTABLE CHEST 1 VIEW COMPARISON:  03/22/2021 FINDINGS: Midline trachea. Normal heart size and mediastinal contours. No pleural effusion or pneumothorax. Clear lungs. IMPRESSION: No active disease. Electronically Signed   By: Jeronimo Greaves M.D.   On: 08/08/2022 09:35    Procedures Procedures    Medications Ordered in ED Medications  sodium chloride 0.9 % bolus 500 mL (0 mLs Intravenous Stopped 08/08/22 1145)    ED Course/ Medical Decision Making/ A&P                             Medical Decision Making Amount and/or Complexity of Data Reviewed Labs: ordered.    Details:  Acute on chronic anemia, last measurement was over a year ago.  Today's hemoglobin 8.2.  Creatinine 1.3.  Hyperglycemia but no acidosis. Radiology: ordered and independent interpretation performed.    Details: No CHF ECG/medicine tests: ordered and independent interpretation performed.    Details: No ischemia  Risk OTC drugs. Prescription drug management.   Patient presents with leg swelling, unclear exact cause.  There is minimal to no pitting edema.  I doubt this is CHF with a normal BNP and no pulmonary edema.  However, she also has this scant bleeding she noticed.  Exam was chaperoned does show some areas that seem to have been scratched that likely bled.  However when discussing with family and patient again multiple times, it seems like the bleeding was both minimal and definitely coming external rather than actual vaginal bleeding or rectal bleeding or melena.  Her hemoglobin is lower than last year though she does have a chronic history of anemia and is supposed to be on iron.  I do not think this represents significant acute blood loss and otherwise does not appear symptomatic.  I think is reasonable to have her get this rechecked as an outpatient and provide a cream to see if this helps with her vaginal symptoms.  No clear infection.  At this point, patient appears stable for discharge home.  She has hyperglycemia but no DKA.  Given a small bolus of fluids.        Final Clinical Impression(s) / ED Diagnoses Final diagnoses:  Hyperglycemia  Anemia, unspecified type    Rx / DC Orders ED Discharge Orders          Ordered    ferrous sulfate 325 (65 FE) MG tablet  Daily        08/08/22 1112    estradiol (ESTRACE VAGINAL) 0.1 MG/GM vaginal cream  Daily at bedtime        08/08/22 1113              Pricilla Loveless, MD 08/08/22 1246

## 2022-08-08 NOTE — ED Triage Notes (Signed)
Pt stated, My sugar is up cause I stated itching yesterday Did not check

## 2022-08-08 NOTE — Discharge Instructions (Addendum)
You are being prescribed a cream to help with the itchiness near your vagina.  Your hemoglobin today is low at 8.2.  You need to make sure you are taking iron supplements and 1 has been prescribed to you.  It is important to call your primary care physician as your hemoglobin needs to be rechecked, ideally this week.  If you develop new or worsening bleeding, especially rectal bleeding or vaginal bleeding, dizziness or lightheadedness, or any other new/concerning symptoms and return to the ER or call 911.

## 2024-01-07 ENCOUNTER — Observation Stay (HOSPITAL_BASED_OUTPATIENT_CLINIC_OR_DEPARTMENT_OTHER)
Admission: EM | Admit: 2024-01-07 | Discharge: 2024-01-10 | Disposition: A | Discharge status: Home / Self Care | Attending: Internal Medicine | Admitting: Internal Medicine

## 2024-01-07 ENCOUNTER — Emergency Department (HOSPITAL_BASED_OUTPATIENT_CLINIC_OR_DEPARTMENT_OTHER)

## 2024-01-07 ENCOUNTER — Other Ambulatory Visit: Payer: Self-pay

## 2024-01-07 ENCOUNTER — Encounter (HOSPITAL_BASED_OUTPATIENT_CLINIC_OR_DEPARTMENT_OTHER): Payer: Self-pay

## 2024-01-07 DIAGNOSIS — R770 Abnormality of albumin: Secondary | ICD-10-CM | POA: Diagnosis not present

## 2024-01-07 DIAGNOSIS — N1831 Chronic kidney disease, stage 3a: Secondary | ICD-10-CM | POA: Insufficient documentation

## 2024-01-07 DIAGNOSIS — D631 Anemia in chronic kidney disease: Secondary | ICD-10-CM | POA: Diagnosis not present

## 2024-01-07 DIAGNOSIS — N3000 Acute cystitis without hematuria: Secondary | ICD-10-CM

## 2024-01-07 DIAGNOSIS — K922 Gastrointestinal hemorrhage, unspecified: Secondary | ICD-10-CM | POA: Insufficient documentation

## 2024-01-07 DIAGNOSIS — E1122 Type 2 diabetes mellitus with diabetic chronic kidney disease: Secondary | ICD-10-CM | POA: Diagnosis not present

## 2024-01-07 DIAGNOSIS — D649 Anemia, unspecified: Secondary | ICD-10-CM | POA: Diagnosis present

## 2024-01-07 DIAGNOSIS — I739 Peripheral vascular disease, unspecified: Secondary | ICD-10-CM | POA: Diagnosis not present

## 2024-01-07 DIAGNOSIS — N390 Urinary tract infection, site not specified: Secondary | ICD-10-CM | POA: Insufficient documentation

## 2024-01-07 DIAGNOSIS — Z794 Long term (current) use of insulin: Secondary | ICD-10-CM | POA: Insufficient documentation

## 2024-01-07 DIAGNOSIS — I129 Hypertensive chronic kidney disease with stage 1 through stage 4 chronic kidney disease, or unspecified chronic kidney disease: Secondary | ICD-10-CM | POA: Diagnosis not present

## 2024-01-07 DIAGNOSIS — E1142 Type 2 diabetes mellitus with diabetic polyneuropathy: Secondary | ICD-10-CM | POA: Diagnosis not present

## 2024-01-07 DIAGNOSIS — R413 Other amnesia: Secondary | ICD-10-CM | POA: Diagnosis not present

## 2024-01-07 DIAGNOSIS — E1059 Type 1 diabetes mellitus with other circulatory complications: Secondary | ICD-10-CM

## 2024-01-07 DIAGNOSIS — I10 Essential (primary) hypertension: Secondary | ICD-10-CM | POA: Diagnosis not present

## 2024-01-07 DIAGNOSIS — R3911 Hesitancy of micturition: Secondary | ICD-10-CM | POA: Diagnosis present

## 2024-01-07 DIAGNOSIS — Z86718 Personal history of other venous thrombosis and embolism: Secondary | ICD-10-CM | POA: Insufficient documentation

## 2024-01-07 DIAGNOSIS — R4182 Altered mental status, unspecified: Secondary | ICD-10-CM | POA: Diagnosis not present

## 2024-01-07 DIAGNOSIS — Z79899 Other long term (current) drug therapy: Secondary | ICD-10-CM | POA: Diagnosis not present

## 2024-01-07 DIAGNOSIS — Z7401 Bed confinement status: Secondary | ICD-10-CM | POA: Diagnosis not present

## 2024-01-07 DIAGNOSIS — N179 Acute kidney failure, unspecified: Secondary | ICD-10-CM | POA: Diagnosis present

## 2024-01-07 DIAGNOSIS — R410 Disorientation, unspecified: Secondary | ICD-10-CM | POA: Diagnosis present

## 2024-01-07 DIAGNOSIS — E119 Type 2 diabetes mellitus without complications: Secondary | ICD-10-CM

## 2024-01-07 DIAGNOSIS — G9340 Encephalopathy, unspecified: Principal | ICD-10-CM | POA: Insufficient documentation

## 2024-01-07 DIAGNOSIS — Z89512 Acquired absence of left leg below knee: Secondary | ICD-10-CM | POA: Insufficient documentation

## 2024-01-07 DIAGNOSIS — I82409 Acute embolism and thrombosis of unspecified deep veins of unspecified lower extremity: Secondary | ICD-10-CM | POA: Diagnosis present

## 2024-01-07 LAB — COMPREHENSIVE METABOLIC PANEL WITH GFR
ALT: 21 U/L (ref 0–44)
AST: 28 U/L (ref 15–41)
Albumin: 3.5 g/dL (ref 3.5–5.0)
Alkaline Phosphatase: 93 U/L (ref 38–126)
Anion gap: 10 (ref 5–15)
BUN: 25 mg/dL — ABNORMAL HIGH (ref 8–23)
CO2: 24 mmol/L (ref 22–32)
Calcium: 9 mg/dL (ref 8.9–10.3)
Chloride: 105 mmol/L (ref 98–111)
Creatinine, Ser: 1.8 mg/dL — ABNORMAL HIGH (ref 0.44–1.00)
GFR, Estimated: 31 mL/min — ABNORMAL LOW (ref >60)
Glucose, Bld: 127 mg/dL — ABNORMAL HIGH (ref 70–99)
Potassium: 4.3 mmol/L (ref 3.5–5.1)
Sodium: 139 mmol/L (ref 135–145)
Total Bilirubin: 0.5 mg/dL (ref 0.0–1.2)
Total Protein: 8.3 g/dL — ABNORMAL HIGH (ref 6.5–8.1)

## 2024-01-07 LAB — I-STAT VENOUS BLOOD GAS, ED
Acid-Base Excess: 0 mmol/L (ref 0.0–2.0)
Bicarbonate: 25.5 mmol/L (ref 20.0–28.0)
Calcium, Ion: 1.2 mmol/L (ref 1.15–1.40)
HCT: 24 % — ABNORMAL LOW (ref 36.0–46.0)
Hemoglobin: 8.2 g/dL — ABNORMAL LOW (ref 12.0–15.0)
O2 Saturation: 49 %
Patient temperature: 36.7
Potassium: 4.2 mmol/L (ref 3.5–5.1)
Sodium: 142 mmol/L (ref 135–145)
TCO2: 27 mmol/L (ref 22–32)
pCO2, Ven: 46.1 mmHg (ref 44–60)
pH, Ven: 7.35 (ref 7.25–7.43)
pO2, Ven: 27 mmHg — CL (ref 32–45)

## 2024-01-07 LAB — CBC WITH DIFFERENTIAL/PLATELET
Abs Immature Granulocytes: 0.01 K/uL (ref 0.00–0.07)
Basophils Absolute: 0.1 K/uL (ref 0.0–0.1)
Basophils Relative: 1 %
Eosinophils Absolute: 0.4 K/uL (ref 0.0–0.5)
Eosinophils Relative: 5 %
HCT: 21.7 % — ABNORMAL LOW (ref 36.0–46.0)
Hemoglobin: 6.8 g/dL — CL (ref 12.0–15.0)
Immature Granulocytes: 0 %
Lymphocytes Relative: 30 %
Lymphs Abs: 2.3 K/uL (ref 0.7–4.0)
MCH: 20.3 pg — ABNORMAL LOW (ref 26.0–34.0)
MCHC: 31.3 g/dL (ref 30.0–36.0)
MCV: 64.8 fL — ABNORMAL LOW (ref 80.0–100.0)
Monocytes Absolute: 0.7 K/uL (ref 0.1–1.0)
Monocytes Relative: 9 %
Neutro Abs: 4.4 K/uL (ref 1.7–7.7)
Neutrophils Relative %: 55 %
Platelets: 283 K/uL (ref 150–400)
RBC: 3.35 MIL/uL — ABNORMAL LOW (ref 3.87–5.11)
RDW: 21.8 % — ABNORMAL HIGH (ref 11.5–15.5)
WBC: 7.9 K/uL (ref 4.0–10.5)
nRBC: 0 % (ref 0.0–0.2)

## 2024-01-07 LAB — URINALYSIS, ROUTINE W REFLEX MICROSCOPIC
Bilirubin Urine: NEGATIVE
Glucose, UA: NEGATIVE mg/dL
Hgb urine dipstick: NEGATIVE
Ketones, ur: NEGATIVE mg/dL
Nitrite: NEGATIVE
Protein, ur: 100 mg/dL — AB
Specific Gravity, Urine: 1.013 (ref 1.005–1.030)
WBC, UA: >50 WBC/hpf (ref 0–5)
pH: 5.5 (ref 5.0–8.0)

## 2024-01-07 LAB — URINE DRUG SCREEN
Amphetamines: NEGATIVE
Barbiturates: NEGATIVE
Benzodiazepines: NEGATIVE
Cocaine: NEGATIVE
Fentanyl: NEGATIVE
Methadone Scn, Ur: NEGATIVE
Opiates: NEGATIVE
Tetrahydrocannabinol: NEGATIVE

## 2024-01-07 LAB — HEMOGLOBIN AND HEMATOCRIT, BLOOD
HCT: 20.3 % — ABNORMAL LOW (ref 36.0–46.0)
Hemoglobin: 6.4 g/dL — CL (ref 12.0–15.0)

## 2024-01-07 LAB — TSH: TSH: 2.65 u[IU]/mL (ref 0.350–4.500)

## 2024-01-07 LAB — GLUCOSE, CAPILLARY
Glucose-Capillary: 137 mg/dL — ABNORMAL HIGH (ref 70–99)
Glucose-Capillary: 246 mg/dL — ABNORMAL HIGH (ref 70–99)

## 2024-01-07 LAB — ETHANOL: Alcohol, Ethyl (B): <15 mg/dL (ref <15)

## 2024-01-07 LAB — OCCULT BLOOD X 1 CARD TO LAB, STOOL: Fecal Occult Bld: NEGATIVE

## 2024-01-07 LAB — HEMOGLOBIN A1C
Hgb A1c MFr Bld: 5.9 % — ABNORMAL HIGH (ref 4.8–5.6)
Mean Plasma Glucose: 122.63 mg/dL

## 2024-01-07 LAB — T4, FREE: Free T4: 0.99 ng/dL (ref 0.61–1.12)

## 2024-01-07 LAB — CBG MONITORING, ED: Glucose-Capillary: 119 mg/dL — ABNORMAL HIGH (ref 70–99)

## 2024-01-07 LAB — PREPARE RBC (CROSSMATCH)

## 2024-01-07 LAB — ABO/RH: ABO/RH(D): AB POS

## 2024-01-07 MED ORDER — SODIUM CHLORIDE 0.9 % IV SOLN
1.0000 g | Freq: Every day | INTRAVENOUS | Status: DC
  Administered 2024-01-08 – 2024-01-10 (×3): 1 g via INTRAVENOUS
  Filled 2024-01-07 (×3): qty 10

## 2024-01-07 MED ORDER — ACETAMINOPHEN 325 MG PO TABS: 650.0000 mg | ORAL_TABLET | Freq: Four times a day (QID) | ORAL | Status: DC | PRN

## 2024-01-07 MED ORDER — MELATONIN 3 MG PO TABS: 3.0000 mg | ORAL_TABLET | Freq: Every evening | ORAL | Status: DC | PRN

## 2024-01-07 MED ORDER — SODIUM CHLORIDE 0.9 % IV BOLUS
1000.0000 mL | Freq: Once | INTRAVENOUS | Status: AC
  Administered 2024-01-07: 1000 mL via INTRAVENOUS

## 2024-01-07 MED ORDER — INSULIN GLARGINE-YFGN 100 UNIT/ML ~~LOC~~ SOLN
5.0000 [IU] | Freq: Two times a day (BID) | SUBCUTANEOUS | Status: DC
  Administered 2024-01-07 – 2024-01-10 (×5): 5 [IU] via SUBCUTANEOUS
  Filled 2024-01-07 (×8): qty 0.05

## 2024-01-07 MED ORDER — ONDANSETRON HCL 4 MG PO TABS: 4.0000 mg | ORAL_TABLET | Freq: Four times a day (QID) | ORAL | Status: DC | PRN

## 2024-01-07 MED ORDER — SODIUM CHLORIDE 0.9% IV SOLUTION: Freq: Once | INTRAVENOUS | Status: AC

## 2024-01-07 MED ORDER — INSULIN ASPART 100 UNIT/ML IJ SOLN
0.0000 [IU] | Freq: Three times a day (TID) | INTRAMUSCULAR | Status: DC
  Administered 2024-01-09 – 2024-01-10 (×2): 1 [IU] via SUBCUTANEOUS

## 2024-01-07 MED ORDER — ACETAMINOPHEN 325 MG PO TABS
650.0000 mg | ORAL_TABLET | Freq: Once | ORAL | Status: AC
  Administered 2024-01-07: 650 mg via ORAL
  Filled 2024-01-07: qty 2

## 2024-01-07 MED ORDER — BISACODYL 5 MG PO TBEC: 5.0000 mg | DELAYED_RELEASE_TABLET | Freq: Every day | ORAL | Status: DC | PRN

## 2024-01-07 MED ORDER — SODIUM CHLORIDE 0.9% IV SOLUTION
Freq: Once | INTRAVENOUS | Status: AC
Start: 2024-01-07 — End: 2024-01-08

## 2024-01-07 MED ORDER — SODIUM CHLORIDE 0.9 % IV SOLN
1.0000 g | Freq: Once | INTRAVENOUS | Status: AC
  Administered 2024-01-07: 1 g via INTRAVENOUS
  Filled 2024-01-07: qty 10

## 2024-01-07 MED ORDER — ONDANSETRON HCL 4 MG/2ML IJ SOLN
4.0000 mg | Freq: Four times a day (QID) | INTRAMUSCULAR | Status: DC | PRN
Start: 2024-01-07 — End: 2024-01-10

## 2024-01-07 MED ORDER — ACETAMINOPHEN 650 MG RE SUPP: 650.0000 mg | Freq: Four times a day (QID) | RECTAL | Status: DC | PRN

## 2024-01-07 NOTE — ED Notes (Signed)
She leaves with [facility] at this time.

## 2024-01-07 NOTE — ED Notes (Signed)
 Assisted Dr.Lawsing in rectal exam for hemoccult card. Sent hemoccult card to lab.

## 2024-01-07 NOTE — ED Notes (Signed)
 Pt. Remains awake and alert and is visiting with her daughter, who has been with her continuously.

## 2024-01-07 NOTE — ED Notes (Signed)
 Spoke with Tinnie at [facility] for transport

## 2024-01-07 NOTE — ED Notes (Signed)
 VBG results given to Dr. Jerrol.

## 2024-01-07 NOTE — ED Notes (Signed)
 Patient has been transported to [facility] rm# [phone]

## 2024-01-07 NOTE — ED Provider Notes (Signed)
 [place] EMERGENCY DEPARTMENT AT [facility] Provider Note   CSN: [id number] Arrival date & time: 01/07/24  1013     Patient presents with: Urinary hesitancy   Brandi Hernandez is a 63 y.o. female.   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  The history provided with the patient's daughter states that she has not been at her baseline.  She was hallucinating last night seeing snakes on the walls.  She has a caregiver with her from her long-term care facility.  She has been having some urinary hesitancy.  No fevers, no chills, no nausea vomiting diarrhea.  She is not currently at her baseline. No stroke like symptoms. She is currently GCS 14, AAO x 1.  Prior to Admission medications   Medication Sig Start Date End Date Taking? Authorizing Provider  amLODipine  (NORVASC ) 5 MG tablet Take 1 tablet (5 mg total) by mouth daily. 09/03/21 04/01/22  Gregg Lek, MD  atorvastatin  (LIPITOR) 40 MG tablet Take 1 tablet (40 mg total) by mouth daily. 09/03/21   Camara, Amadou, MD  buPROPion  (WELLBUTRIN  XL) 150 MG 24 hr tablet Take 1 tablet (150 mg total) by mouth daily. 09/03/21 04/01/22  Gregg Lek, MD  estradiol  (ESTRACE  VAGINAL) 0.1 MG/GM vaginal cream Place 1 Applicatorful vaginally at bedtime. 08/08/22   Freddi Hamilton, MD  ferrous sulfate  325 (65 FE) MG tablet Take 1 tablet (325 mg total) by mouth daily. 08/08/22   Freddi Hamilton, MD  gabapentin  (NEURONTIN ) 100 MG capsule Take 1 capsule (100 mg total) by mouth 3 (three) times daily. 09/03/21 04/01/22  Camara, Amadou, MD  insulin isophane & regular human KwikPen (NOVOLIN  70/30 KWIKPEN) (70-30) 100 UNIT/ML KwikPen 20 in the am and 25 at night 09/03/21   Gregg Lek, MD  lisinopril  (ZESTRIL ) 10 MG tablet Take 1 tablet (10 mg total) by mouth daily. 09/03/21 04/01/22  Camara, Amadou, MD     Allergies: Patient has no known allergies.    Review of Systems  All other systems reviewed and are negative.   Updated Vital Signs BP (!) 180/84   Pulse 76   Temp 98 F (36.7 C) (Oral)   Resp 17   SpO2 100%   Physical Exam Vitals and nursing note reviewed. Exam conducted with a chaperone present.  Constitutional:      General: She is not in acute distress.    Appearance: She is well-developed.  HENT:     Head: Normocephalic and atraumatic.  Eyes:     Conjunctiva/sclera: Conjunctivae normal.  Cardiovascular:     Rate and Rhythm: Normal rate and regular rhythm.     Heart sounds: No murmur heard. Pulmonary:     Effort: Pulmonary effort is normal. No respiratory distress.     Breath sounds: Normal breath sounds.  Abdominal:     Palpations: Abdomen is soft.     Tenderness: There is no abdominal tenderness.  Genitourinary:    Comments: No melena or hematochezia, fecal occult negative Musculoskeletal:        General: No swelling.     Cervical back: Neck supple.  Skin:    General: Skin is warm and dry.     Capillary Refill: Capillary refill takes less than 2 seconds.  Neurological:     Mental Status: She is alert.     Comments: MENTAL STATUS EXAM:    Orientation: Alert and  oriented to person. DISORIENTED TO PLACE AND TIME Memory: Cooperative, follows commands well.  Language: Speech is clear and language is normal.   CRANIAL NERVES:    CN 2 (Optic): Visual fields intact to confrontation.  CN 3,4,6 (EOM): Pupils equal and reactive to light. Full extraocular eye movement without nystagmus.  CN 5 (Trigeminal): Facial sensation is normal, no weakness of masticatory muscles.  CN 7 (Facial): No facial weakness or asymmetry.  CN 8 (Auditory): Auditory acuity grossly normal.  CN 9,10 (Glossophar): The uvula is midline, the palate elevates symmetrically.  CN 11 (spinal access): Normal sternocleidomastoid and trapezius strength.  CN 12 (Hypoglossal): The tongue is midline.  No atrophy or fasciculations.Brandi Hernandez   MOTOR:  Muscle Strength: 5/5RUE, 5/5LUE, 5/5RLE, 5/5LLE.   COORDINATION:  No tremor.   SENSATION:   Intact to light touch all four extremities.     Psychiatric:        Mood and Affect: Mood normal.     (all labs ordered are listed, but only abnormal results are displayed) Labs Reviewed  URINALYSIS, ROUTINE W REFLEX MICROSCOPIC - Abnormal; Notable for the following components:      Result Value   APPearance HAZY (*)    Protein, ur 100 (*)    Leukocytes,Ua LARGE (*)    Bacteria, UA RARE (*)    All other components within normal limits  COMPREHENSIVE METABOLIC PANEL WITH GFR - Abnormal; Notable for the following components:   Glucose, Bld 127 (*)    BUN 25 (*)    Creatinine, Ser 1.80 (*)    Total Protein 8.3 (*)    GFR, Estimated 31 (*)    All other components within normal limits  CBC WITH DIFFERENTIAL/PLATELET - Abnormal; Notable for the following components:   RBC 3.35 (*)    Hemoglobin 6.8 (*)    HCT 21.7 (*)    MCV 64.8 (*)    MCH 20.3 (*)    RDW 21.8 (*)    All other components within normal limits  HEMOGLOBIN AND HEMATOCRIT, BLOOD - Abnormal; Notable for the following components:   Hemoglobin 6.4 (*)    HCT 20.3 (*)    All other components within normal limits  CBG MONITORING, ED - Abnormal; Notable for the following components:   Glucose-Capillary 119 (*)    All other components within normal limits  I-STAT VENOUS BLOOD GAS, ED - Abnormal; Notable for the following components:   pO2, Ven 27 (*)    HCT 24.0 (*)    Hemoglobin 8.2 (*)    All other components within normal limits  CULTURE, BLOOD (ROUTINE X 2)  CULTURE, BLOOD (ROUTINE X 2)  URINE DRUG SCREEN  ETHANOL  TSH  OCCULT BLOOD X 1 CARD TO LAB, STOOL  T4, FREE    EKG: EKG Interpretation Date/Time:  Saturday January 07 2024 11:25:27 EDT Ventricular Rate:  76 PR Interval:  161 QRS Duration:  93 QT Interval:  396 QTC Calculation: 446 R Axis:   16  Text  Interpretation: Sinus rhythm Nonspecific T abnormalities, lateral leads No significant change since last tracing Confirmed by Jerrol Agent ([id number]) on 01/07/2024 11:44:37 AM  Radiology: CT HEAD WO CONTRAST Result Date: 01/07/2024 CLINICAL DATA:  Mental status change. EXAM: CT HEAD WITHOUT CONTRAST TECHNIQUE: Contiguous axial images were obtained from the base of the skull through the vertex without intravenous contrast. RADIATION DOSE REDUCTION: This exam was performed according to the departmental dose-optimization program which includes automated exposure control, adjustment of the mA and/or kV according  to patient size and/or use of iterative reconstruction technique. COMPARISON:  03/22/2021 FINDINGS: Brain: Ventricles, cisterns and other CSF spaces are normal. No mass, mass effect, shift of midline structures or acute hemorrhage. No evidence of acute infarction. Vascular: No hyperdense vessel or unexpected calcification. Skull: Normal. Negative for fracture or focal lesion. Sinuses/Orbits: Orbits are normal. Paranasal sinuses are well developed and otherwise clear. Other: None. IMPRESSION: No acute findings. Electronically Signed   By: Toribio Agreste M.D.   On: 01/07/2024 11:52   DG Chest Port 1 View Result Date: 01/07/2024 CLINICAL DATA:  Altered mental status. EXAM: PORTABLE CHEST 1 VIEW COMPARISON:  08/08/2022 FINDINGS: The lungs are clear without focal pneumonia, edema, pneumothorax or pleural effusion. The cardiopericardial silhouette is within normal limits for size. No acute bony abnormality. Telemetry leads overlie the chest. IMPRESSION: No active disease. Electronically Signed   By: Camellia Candle M.D.   On: 01/07/2024 11:41     .Critical Care  Performed by: Jerrol Agent, MD Authorized by: Jerrol Agent, MD   Critical care provider statement:    Critical care time (minutes):  30   Critical care was time spent personally by me on the following activities:  Development of treatment plan  with patient or surrogate, discussions with consultants, evaluation of patient's response to treatment, examination of patient, ordering and review of laboratory studies, ordering and review of radiographic studies, ordering and performing treatments and interventions, pulse oximetry, re-evaluation of patient's condition and review of old charts   Care discussed with: admitting provider      Medications Ordered in the ED  cefTRIAXone (ROCEPHIN) 1 g in sodium chloride  0.9 % 100 mL IVPB (0 g Intravenous Stopped 01/07/24 1220)  sodium chloride  0.9 % bolus 1,000 mL (0 mLs Intravenous Stopped 01/07/24 1345)    Clinical Course as of 01/07/24 1442  Sat Jan 07, 2024  1053 Bacteria, UA(!): RARE [JL]  1053 WBC, UA: >50 [JL]  1053 Leukocytes,Ua(!): LARGE [JL]    Clinical Course User Index [JL] Jerrol Agent, MD                                 Medical Decision Making Amount and/or Complexity of Data Reviewed Labs: ordered. Decision-making details documented in ED Course. Radiology: ordered.  Risk Decision regarding hospitalization.    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  The history provided with the patient's daughter states that she has not been at her baseline.  She was hallucinating last night seeing snakes on the walls.  She has a caregiver with her from her long-term care facility.  She has been having some urinary hesitancy.  No fevers, no chills, no nausea vomiting diarrhea.  She is not currently at her baseline. No stroke like symptoms. She is currently GCS 14, AAO x 1.  Medical Decision Making:   Brandi Hernandez is a 63 y.o. female who presented to the ED today with altered mental status detailed above.     Complete initial physical exam performed, notably the patient  was CTAB, neuro intact, AAOx1.    Reviewed and confirmed nursing  documentation for past medical history, family history, social history.    Initial Assessment:   With the patient's presentation of altered mental status, most likely diagnosis is delerium 2/2 infectious etiology (UTI/CAP/URI) vs metabolic abnormality (Na/K/Mg/Ca)  vs nonspecific etiology. Other diagnoses were considered including (but not limited to) CVA, ICH, intracranial mass, critical dehydration, heptatic dysfunction, uremia, hypercarbia, intoxication, endrocrine abnormality, toxidrome. These are considered less likely due to history of present illness and physical exam findings.   This is most consistent with an acute life/limb threatening illness complicated by underlying chronic conditions.  Initial Plan:  CTH to evaluate for intracranial etiology of patient's symptoms  Screening labs including CBC and Metabolic panel to evaluate for infectious or metabolic etiology of disease.  Urinalysis with reflex culture ordered to evaluate for UTI or relevant urologic/nephrologic pathology.  CXR to evaluate for structural/infectious intrathoracic pathology.  TSH for evaluation for endrocrine etiology VBG for acid/base status and further toxidrome evaulation EKG to evaluate for cardiac pathology Objective evaluation as below reviewed   Initial Study Results:   Laboratory  All laboratory results reviewed without evidence of clinically relevant pathology.   Exceptions include: Hgb 6.8, no melena or hematochezia, notably vbg hgb 8.2, recheck h/h 6.4, TSH normal, UA with evidence of UTI with large leukocytes, >50WBCs, rare bacteria present. CMP with an AKI with a Cr of 1.8 from a baseline of 1.4.   EKG EKG was reviewed independently. Rate, rhythm, axis, intervals all examined and without medically relevant abnormality. ST segments without concerns for elevations.    Radiology:  All images reviewed independently. Agree with radiology report at this time.   CT HEAD WO CONTRAST Result Date:  01/07/2024 CLINICAL DATA:  Mental status change. EXAM: CT HEAD WITHOUT CONTRAST TECHNIQUE: Contiguous axial images were obtained from the base of the skull through the vertex without intravenous contrast. RADIATION DOSE REDUCTION: This exam was performed according to the departmental dose-optimization program which includes automated exposure control, adjustment of the mA and/or kV according to patient size and/or use of iterative reconstruction technique. COMPARISON:  03/22/2021 FINDINGS: Brain: Ventricles, cisterns and other CSF spaces are normal. No mass, mass effect, shift of midline structures or acute hemorrhage. No evidence of acute infarction. Vascular: No hyperdense vessel or unexpected calcification. Skull: Normal. Negative for fracture or focal lesion. Sinuses/Orbits: Orbits are normal. Paranasal sinuses are well developed and otherwise clear. Other: None. IMPRESSION: No acute findings. Electronically Signed   By: Toribio Agreste M.D.   On: 01/07/2024 11:52   DG Chest Port 1 View Result Date: 01/07/2024 CLINICAL DATA:  Altered mental status. EXAM: PORTABLE CHEST 1 VIEW COMPARISON:  08/08/2022 FINDINGS: The lungs are clear without focal pneumonia, edema, pneumothorax or pleural effusion. The cardiopericardial silhouette is within normal limits for size. No acute bony abnormality. Telemetry leads overlie the chest. IMPRESSION: No active disease. Electronically Signed   By: Camellia Candle M.D.   On: 01/07/2024 11:41       Final Assessment and Plan:   Patient presenting with anemia, will require blood transfusion, no concern for acute GI bleed with no melena, hematochezia, fecal occult was negative.  Patient presenting with acute encephalopathy/delirium in the setting of a urinary tract infection, administered an NaCl bolus as well as Rocephin IV.  Will likely need blood transfusion inpatient for anemia.  CT of the head was unremarkable and chest x-ray showed no acute abnormality.  Hospitalist  medicine consulted for admission for further management in the setting of UTI, encephalopathy/delirium, AKI and anemia.  Subsequently admitted in critical but stable condition      Final diagnoses:  Delirium  Acute cystitis without hematuria  Anemia, unspecified type  AKI (acute kidney injury)    ED Discharge Orders  None          Jerrol Agent, MD 01/07/24 [phone]

## 2024-01-07 NOTE — ED Notes (Signed)
 Critical lab value - hemoglobin 6.8. Dr Jerrol and primary RN notified.

## 2024-01-07 NOTE — ED Triage Notes (Signed)
 She has a caregiver with her from her long term care facility. Pt. C/o some recent urinary hesitancy. Her aide also states pt. Has been having some visual hallucinations. Her Aide states pt. Has a remote hx of intermittent hallucinations. She denies recent fever/n/v/d nor any other sign of current illness.

## 2024-01-07 NOTE — ED Notes (Signed)
 CRITICAL VALUE STICKER  CRITICAL VALUE:Hgb 6.4  RECEIVER (on-site recipient of call):ONEIDA Sharps, RN  DATE & TIME NOTIFIED:   MESSENGER (representative from lab):  MD NOTIFIED: Lawsing  TIME OF NOTIFICATION:1226  RESPONSE:

## 2024-01-07 NOTE — Plan of Care (Signed)
  Problem: Education: Goal: Knowledge of General Education information will improve Description: Including pain rating scale, medication(s)/side effects and non-pharmacologic comfort measures Outcome: Progressing   Problem: Clinical Measurements: Goal: Ability to maintain clinical measurements within normal limits will improve Outcome: Progressing   Problem: Activity: Goal: Risk for activity intolerance will decrease Outcome: Progressing   Problem: Nutrition: Goal: Adequate nutrition will be maintained Outcome: Progressing   Problem: Coping: Goal: Level of anxiety will decrease Outcome: Progressing   Problem: Elimination: Goal: Will not experience complications related to bowel motility Outcome: Progressing   Problem: Pain Managment: Goal: General experience of comfort will improve and/or be controlled Outcome: Progressing   Problem: Safety: Goal: Ability to remain free from injury will improve Outcome: Progressing

## 2024-01-07 NOTE — H&P (Addendum)
 History and Physical    Patient: Brandi Hernandez FMW:[id number] DOB: [birth year] DOA: 01/07/2024 DOS: the patient was seen and examined on 01/07/2024 PCP: Pcp, No  Patient coming from: SNF  Chief Complaint:  Chief Complaint  Patient presents with   Urinary hesitancy   HPI: Brandi Hernandez is a 63 y.o. 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  My history comes from the ED provider as the patient is not able to recall the details.  The patient was apparently seeing snakes on the walls.  This is not her baseline.  She has not had fevers or chills or nausea or vomiting or diarrhea. Her caregiver does report some urinary hesitancy.   In the ED her workup included basic blood work and a urinalysis.  She did have multiple leukocytes in her urine and her hemoglobin was found to be 6.4.  The patient has no abdominal pain.  Her appetite seems to be good.  The hemoglobin was repeated and was 6.8 on repeat.  She was guaiac negative.  She will be admitted to the hospitalist service for further workup and management. She did receive IV Rocephin in the ED.  Review of Systems: unable to review all systems due to the inability of the patient to answer questions. Past Medical History:  Diagnosis Date   Deep vein thrombosis (DVT) (HCC)    Diabetes mellitus without complication (HCC)    Hypercholesterolemia    Hypertension    Memory deficit    Peripheral neuropathy    Renal disorder    Past Surgical History:  Procedure Laterality Date   LEG AMPUTATION Left    Below the knee   TOE AMPUTATION Right    Social History:  reports that she has never smoked. She has never used smokeless tobacco. She reports current alcohol use. She reports that she does not use drugs.  No Known Allergies  History reviewed. No pertinent family history.  Prior to Admission medications    Medication Sig Start Date End Date Taking? Authorizing Provider  amLODipine  (NORVASC ) 5 MG tablet Take 1 tablet (5 mg total) by mouth daily. 09/03/21 04/01/22  Gregg Lek, MD  atorvastatin  (LIPITOR) 40 MG tablet Take 1 tablet (40 mg total) by mouth daily. 09/03/21   Camara, Amadou, MD  buPROPion  (WELLBUTRIN  XL) 150 MG 24 hr tablet Take 1 tablet (150 mg total) by mouth daily. 09/03/21 04/01/22  Gregg Lek, MD  estradiol  (ESTRACE  VAGINAL) 0.1 MG/GM vaginal cream Place 1 Applicatorful vaginally at bedtime. 08/08/22   Freddi Hamilton, MD  ferrous sulfate  325 (65 FE) MG tablet Take 1 tablet (325 mg total) by mouth daily. 08/08/22   Freddi Hamilton, MD  gabapentin  (NEURONTIN ) 100 MG capsule Take 1 capsule (100 mg total) by mouth 3 (three) times daily. 09/03/21 04/01/22  Camara, Amadou, MD  insulin isophane & regular human KwikPen (NOVOLIN  70/30 KWIKPEN) (70-30) 100 UNIT/ML KwikPen 20 in the am and 25 at night 09/03/21   Gregg Lek, MD  lisinopril  (ZESTRIL ) 10 MG tablet Take 1 tablet (10 mg total) by mouth daily. 09/03/21 04/01/22  Gregg Lek, MD    Physical Exam: Vitals:   01/07/24 1100 01/07/24 1200 01/07/24 1300 01/07/24 1535  BP: (!) 179/77 (!) 192/90 (!) 180/84 (!) 175/74  Pulse: 78 77 76 77  Resp: 16 18 17 18   Temp:    97.7 F (36.5 C)  TempSrc:    Oral  SpO2: 97% 100% 100% 100%  Weight:    71.8 kg  Height:    5' 11 (1.803 m)   Physical Exam:  General: No acute distress, well developed, well nourished HEENT: Normocephalic, atraumatic, PERRL Cardiovascular: Normal rate and rhythm. Distal pulses intact. Pulmonary: Normal pulmonary effort, normal breath sounds Gastrointestinal: Nondistended abdomen, soft, non-tender, normoactive bowel sounds Musculoskeletal:No lower ext edema, Left Bka, Rt foot all toes amputated Skin: Skin is warm and dry. Neuro: No focal deficits noted, Alert and cooperative PSYCH: Attentive and pleasant  Data Reviewed:  Results for orders placed or  performed during the hospital encounter of 01/07/24 (from the past 24 hours)  Urinalysis, Routine w reflex microscopic -Urine, Clean Catch     Status: Abnormal   Collection Time: 01/07/24 10:34 AM  Result Value Ref Range   Color, Urine YELLOW YELLOW   APPearance HAZY (A) CLEAR   Specific Gravity, Urine 1.013 1.005 - 1.030   pH 5.5 5.0 - 8.0   Glucose, UA NEGATIVE NEGATIVE mg/dL   Hgb urine dipstick NEGATIVE NEGATIVE   Bilirubin Urine NEGATIVE NEGATIVE   Ketones, ur NEGATIVE NEGATIVE mg/dL   Protein, ur 899 (A) NEGATIVE mg/dL   Nitrite NEGATIVE NEGATIVE   Leukocytes,Ua LARGE (A) NEGATIVE   RBC / HPF 0-5 0 - 5 RBC/hpf   WBC, UA >50 0 - 5 WBC/hpf   Bacteria, UA RARE (A) NONE SEEN   Squamous Epithelial / HPF 0-5 0 - 5 /HPF   WBC Clumps PRESENT    Mucus PRESENT   Comprehensive metabolic panel     Status: Abnormal   Collection Time: 01/07/24 11:13 AM  Result Value Ref Range   Sodium 139 135 - 145 mmol/L   Potassium 4.3 3.5 - 5.1 mmol/L   Chloride 105 98 - 111 mmol/L   CO2 24 22 - 32 mmol/L   Glucose, Bld 127 (H) 70 - 99 mg/dL   BUN 25 (H) 8 - 23 mg/dL   Creatinine, Ser 8.19 (H) 0.44 - 1.00 mg/dL   Calcium  9.0 8.9 - 10.3 mg/dL   Total Protein 8.3 (H) 6.5 - 8.1 g/dL   Albumin 3.5 3.5 - 5.0 g/dL   AST 28 15 - 41 U/L   ALT 21 0 - 44 U/L   Alkaline Phosphatase 93 38 - 126 U/L   Total Bilirubin 0.5 0.0 - 1.2 mg/dL   GFR, Estimated 31 (L) >60 mL/min   Anion gap 10 5 - 15  CBC WITH DIFFERENTIAL     Status: Abnormal   Collection Time: 01/07/24 11:13 AM  Result Value Ref Range   WBC 7.9 4.0 - 10.5 K/uL   RBC 3.35 (L) 3.87 - 5.11 MIL/uL   Hemoglobin 6.8 (LL) 12.0 - 15.0 g/dL   HCT 78.2 (L) 63.9 - 53.9 %   MCV 64.8 (L) 80.0 - 100.0 fL   MCH 20.3 (L) 26.0 - 34.0 pg   MCHC 31.3 30.0 - 36.0 g/dL   RDW 78.1 (H) 88.4 - 84.4 %   Platelets 283 150 - 400 K/uL   nRBC 0.0 0.0 - 0.2 %   Neutrophils Relative % 55 %   Neutro Abs 4.4 1.7 - 7.7 K/uL   Lymphocytes Relative 30 %   Lymphs Abs 2.3  0.7 - 4.0 K/uL   Monocytes Relative 9 %   Monocytes Absolute 0.7 0.1 - 1.0 K/uL   Eosinophils Relative 5 %   Eosinophils Absolute 0.4 0.0 - 0.5 K/uL   Basophils  Relative 1 %   Basophils Absolute 0.1 0.0 - 0.1 K/uL   Immature Granulocytes 0 %   Abs Immature Granulocytes 0.01 0.00 - 0.07 K/uL  Urine rapid drug screen (hosp performed)     Status: None   Collection Time: 01/07/24 11:13 AM  Result Value Ref Range   Opiates NEGATIVE NEGATIVE   Cocaine NEGATIVE NEGATIVE   Benzodiazepines NEGATIVE NEGATIVE   Amphetamines NEGATIVE NEGATIVE   Tetrahydrocannabinol NEGATIVE NEGATIVE   Barbiturates NEGATIVE NEGATIVE   Methadone Scn, Ur NEGATIVE NEGATIVE   Fentanyl NEGATIVE NEGATIVE  Ethanol     Status: None   Collection Time: 01/07/24 11:13 AM  Result Value Ref Range   Alcohol, Ethyl (B) <15 <15 mg/dL  TSH     Status: None   Collection Time: 01/07/24 11:13 AM  Result Value Ref Range   TSH 2.650 0.350 - 4.500 uIU/mL  T4, free     Status: None   Collection Time: 01/07/24 11:13 AM  Result Value Ref Range   Free T4 0.99 0.61 - 1.12 ng/dL  CBG monitoring, ED     Status: Abnormal   Collection Time: 01/07/24 11:17 AM  Result Value Ref Range   Glucose-Capillary 119 (H) 70 - 99 mg/dL  I-Stat venous blood gas, ED     Status: Abnormal   Collection Time: 01/07/24 11:30 AM  Result Value Ref Range   pH, Ven 7.350 7.25 - 7.43   pCO2, Ven 46.1 44 - 60 mmHg   pO2, Ven 27 (LL) 32 - 45 mmHg   Bicarbonate 25.5 20.0 - 28.0 mmol/L   TCO2 27 22 - 32 mmol/L   O2 Saturation 49 %   Acid-Base Excess 0.0 0.0 - 2.0 mmol/L   Sodium 142 135 - 145 mmol/L   Potassium 4.2 3.5 - 5.1 mmol/L   Calcium , Ion 1.20 1.15 - 1.40 mmol/L   HCT 24.0 (L) 36.0 - 46.0 %   Hemoglobin 8.2 (L) 12.0 - 15.0 g/dL   Patient temperature 63.2 C    Collection site IV start    Drawn by Nurse    Sample type VENOUS    Comment NOTIFIED PHYSICIAN   Occult blood card to lab, stool     Status: None   Collection Time: 01/07/24 11:35 AM   Result Value Ref Range   Fecal Occult Bld NEGATIVE NEGATIVE  Hemoglobin and hematocrit, blood     Status: Abnormal   Collection Time: 01/07/24 12:16 PM  Result Value Ref Range   Hemoglobin 6.4 (LL) 12.0 - 15.0 g/dL   HCT 79.6 (L) 63.9 - 53.9 %     Assessment and Plan: Acute encephalopathy w hallucinations Possible UTI Acute blood loss anemia  Memory loss - IV Rocephin - Hgb repeated and confirmed.  Transfuse 2 untis prbscs.  She is guaiac negative but would still consider GI consult. - NPO after midnight.  Patient wants to eat. - Hold all nonessential medications  5. DM - Some places in her chart say Type 1 DM and some notes say type 2. Will continue her insulin in case she does have type 1 but with her being NPO will start Lantus rather than 70/30.  Will add corrective dose insulin as needed.  6. Htn -we will hold her antihypertensives until she starts eating again.  7. AKI - Follow creatinine as she is volume repleated.   Advance Care Planning:   Code Status: Not on file the patient will be full code by default.  She does not have capacity  to make decision.  Consults: none  Family Communication: none  Severity of Illness: The appropriate patient status for this patient is INPATIENT. Inpatient status is judged to be reasonable and necessary in order to provide the required intensity of service to ensure the patient's safety. The patient's presenting symptoms, physical exam findings, and initial radiographic and laboratory data in the context of their chronic comorbidities is felt to place them at high risk for further clinical deterioration. Furthermore, it is not anticipated that the patient will be medically stable for discharge from the hospital within 2 midnights of admission.   * I certify that at the point of admission it is my clinical judgment that the patient will require inpatient hospital care spanning beyond 2 midnights from the point of admission due to high  intensity of service, high risk for further deterioration and high frequency of surveillance required.*  Author: ARTHEA CHILD, MD 01/07/2024 4:09 PM  For on call review [email or web address].

## 2024-01-08 DIAGNOSIS — D62 Acute posthemorrhagic anemia: Secondary | ICD-10-CM | POA: Diagnosis not present

## 2024-01-08 LAB — URINE CULTURE: Culture: <10000 — AB

## 2024-01-08 LAB — CBC
HCT: 29.6 % — ABNORMAL LOW (ref 36.0–46.0)
Hemoglobin: 9.2 g/dL — ABNORMAL LOW (ref 12.0–15.0)
MCH: 21.7 pg — ABNORMAL LOW (ref 26.0–34.0)
MCHC: 31.1 g/dL (ref 30.0–36.0)
MCV: 70 fL — ABNORMAL LOW (ref 80.0–100.0)
Platelets: 206 K/uL (ref 150–400)
RBC: 4.23 MIL/uL (ref 3.87–5.11)
RDW: 22.5 % — ABNORMAL HIGH (ref 11.5–15.5)
WBC: 7.6 K/uL (ref 4.0–10.5)
nRBC: 0 % (ref 0.0–0.2)

## 2024-01-08 LAB — HIV ANTIBODY (ROUTINE TESTING W REFLEX): HIV Screen 4th Generation wRfx: NONREACTIVE

## 2024-01-08 LAB — BASIC METABOLIC PANEL WITH GFR
Anion gap: 10 (ref 5–15)
BUN: 19 mg/dL (ref 8–23)
CO2: 21 mmol/L — ABNORMAL LOW (ref 22–32)
Calcium: 9 mg/dL (ref 8.9–10.3)
Chloride: 108 mmol/L (ref 98–111)
Creatinine, Ser: 1.39 mg/dL — ABNORMAL HIGH (ref 0.44–1.00)
GFR, Estimated: 42 mL/min — ABNORMAL LOW (ref >60)
Glucose, Bld: 92 mg/dL (ref 70–99)
Potassium: 4.3 mmol/L (ref 3.5–5.1)
Sodium: 140 mmol/L (ref 135–145)

## 2024-01-08 LAB — GLUCOSE, CAPILLARY
Glucose-Capillary: 87 mg/dL (ref 70–99)
Glucose-Capillary: 98 mg/dL (ref 70–99)
Glucose-Capillary: 140 mg/dL — ABNORMAL HIGH (ref 70–99)
Glucose-Capillary: 113 mg/dL — ABNORMAL HIGH (ref 70–99)

## 2024-01-08 LAB — IRON AND TIBC
Iron: 136 ug/dL (ref 28–170)
Saturation Ratios: 41 % — ABNORMAL HIGH (ref 10.4–31.8)
TIBC: 335 ug/dL (ref 250–450)
UIBC: 199 ug/dL

## 2024-01-08 LAB — MAGNESIUM: Magnesium: 2.3 mg/dL (ref 1.7–2.4)

## 2024-01-08 LAB — FERRITIN: Ferritin: 15 ng/mL (ref 11–307)

## 2024-01-08 MED ORDER — AMLODIPINE BESYLATE 5 MG PO TABS
5.0000 mg | ORAL_TABLET | Freq: Every day | ORAL | Status: DC
  Administered 2024-01-08 – 2024-01-10 (×3): 5 mg via ORAL
  Filled 2024-01-08 (×3): qty 1

## 2024-01-08 MED ORDER — DEXTROSE-SODIUM CHLORIDE 5-0.45 % IV SOLN: INTRAVENOUS | Status: DC

## 2024-01-08 NOTE — TOC Initial Note (Signed)
 Transition of Care [facility]) - Initial/Assessment Note    Patient Details  Name: Brandi Hernandez MRN: [id number] Date of Birth: [birth year]  Transition of Care [facility]) CM/SW Contact:    Sonda Manuella Quill, RN Phone Number: 01/08/2024, 1:19 PM  Clinical Narrative:                 No PCP listed; spoke w/ pt and dtr [name] [phone]); pt not oriented x 4; pt's dtr said she lives at home; she plans for pt to return at d/c; her dtr will provide transportation; [name] said pt's PCP is w/ [facility]; insurance verified; pt has wheelchair, BSC, and shower chair; pt does not receive HH services or home oxygen; IP CM will follow.  Expected Discharge Plan: Home/Self Care Barriers to Discharge: Continued Medical Work up   Patient Goals and CMS Choice Patient states their goals for this hospitalization and ongoing recovery are:: home CMS Medicare.gov Compare Post Acute Care list provided to:: Patient Represenative (must comment) [name] (dtr))   [place] ownership interest in [facility].provided to:: Adult Children    Expected Discharge Plan and Services   Discharge Planning Services: CM Consult   Living arrangements for the past 2 months: Single Family Home                 DME Arranged: N/A DME Agency: NA       HH Arranged: NA HH Agency: NA        Prior Living Arrangements/Services Living arrangements for the past 2 months: Single Family Home Lives with:: Adult Children Patient language and need for interpreter reviewed:: Yes Do you feel safe going back to the place where you live?: Yes      Need for Family Participation in Patient Care: Yes (Comment) Care giver support system in place?: Yes (comment) Current home services: DME (wheelchair, shower chair) Criminal Activity/Legal Involvement Pertinent to Current Situation/Hospitalization: No - Comment as needed  Activities of Daily Living   ADL Screening (condition at time of  admission) Independently performs ADLs?: No Does the patient have a NEW difficulty with bathing/dressing/toileting/self-feeding that is expected to last >3 days?: No Does the patient have a NEW difficulty with getting in/out of bed, walking, or climbing stairs that is expected to last >3 days?: No Does the patient have a NEW difficulty with communication that is expected to last >3 days?: No Is the patient deaf or have difficulty hearing?: No Does the patient have difficulty seeing, even when wearing glasses/contacts?: No Does the patient have difficulty concentrating, remembering, or making decisions?: Yes  Permission Sought/Granted Permission sought to share information with : Case Manager Permission granted to share information with : Yes, Verbal Permission Granted  Share Information with NAME: Case Manager     Permission granted to share info w Relationship: [name] (dtr) [phone]     Emotional Assessment Appearance:: Appears stated age Attitude/Demeanor/Rapport: Gracious Affect (typically observed): Accepting Orientation: : Oriented to Self Alcohol / Substance Use: Not Applicable Psych Involvement: No (comment)  Admission diagnosis:  Delirium [R41.0] UTI (urinary tract infection) [N39.0] Acute cystitis without hematuria [N30.00] AKI (acute kidney injury) [N17.9] Anemia, unspecified type [D64.9] Patient Active Problem List   Diagnosis Date Noted   UTI (urinary tract infection) 01/07/2024   Acute encephalopathy 01/07/2024   Peripheral neuropathy 10/19/2020   Hypercholesterolemia 10/19/2020   Memory deficit 10/19/2020   Deep vein thrombosis (DVT) (HCC) 10/19/2020   Symptomatic anemia 05/10/2020   GI bleed 05/10/2020  DM (diabetes mellitus) (HCC) 05/05/2010   PCP:  Pcp, No Pharmacy:   [facility] [address] [place] [address] Phone: [phone] Fax: [phone]     Social Drivers of Health (SDOH) Social  History: SDOH Screenings   Food Insecurity: No Food Insecurity (01/08/2024)  Housing: Low Risk  (01/08/2024)  Transportation Needs: No Transportation Needs (01/08/2024)  Utilities: Not At Risk (01/08/2024)  Tobacco Use: Low Risk  (01/07/2024)   SDOH Interventions: Food Insecurity Interventions: Intervention Not Indicated, Inpatient TOC Housing Interventions: Intervention Not Indicated, Inpatient TOC Transportation Interventions: Intervention Not Indicated, Inpatient TOC Utilities Interventions: Intervention Not Indicated, Inpatient TOC   Readmission Risk Interventions     No data to display

## 2024-01-08 NOTE — Progress Notes (Signed)
 Progress Note   Patient: Brandi Hernandez FMW:[id number] DOB: 01/25/1961 DOA: 01/07/2024     0 DOS: the patient was seen and examined on 01/08/2024   Brief hospital course: This patient is a 63 yo F w/ h/o HTN, T2DM, PVD s/p L BKA and R TMA brought in for auditory hallucinations from SNF thought to be due to UTI.    She was found to have a hgb of 6.8 and 6.4  with a microcytosis and a ferritin of 15.   She denies any hematemesis or blood in her stool. She is supposed to be on antiplatelets but does not take these currently. She is not on any anticoagulation. She rarely takes NSAIDs for pain.  Per her caregiver and patient she has no other bleeding.   In 04/2020 she was evluated for melena in [place] .     Colonoscopy at that time revealed 2 polyps in the transverse colon measuring 4-5 mm in diameter that were resected. One 4 mm polyp in the descending colon was resected. Diverticulosis in the sigmoid colon. No evidence of bleeding in the colon.  EGD also unrevealing for source of bleeding.   She was to get a capsule endoscopy but unclear if she ever got that.    She is currently HDS and appropriately responded to transfusion of 2u of PRBCs.      Assessment and Plan: Blood loss anemia  Unclear tempo.   Hgb responded to 2u prbcs. Has had GIB in the past with nl EGD/Colo in 2022. Scheduled to get capsule but never got this.   Denies hematemesis/melena, hematochezia. No NSAID use.  Stool guaic negative.  GI consulted.   Auditory hallucinations Resolved.  Possibly in setting of infeciton.   Possible UTI Continue  antibiotics   Unclear if T1DM or T2DM Will continue low dose long acting   HTN  Patient not taking medications regularly. Resumed amlodipine .     PVD  Resume antiplt and statin on discharge.       Subjective: Feels back to baseline. No hematemesis or melena. Does not take medications regularly.   Physical Exam: Vitals:   01/08/24 0510 01/08/24  0529 01/08/24 0850 01/08/24 1321  BP: (!) 171/72 (!) 163/68 (!) 159/66 (!) 190/80  Pulse: 84 79 84 81  Resp: 17 18 18 18   Temp: 98.3 F (36.8 C) 98.3 F (36.8 C) 98.9 F (37.2 C) 98 F (36.7 C)  TempSrc: Oral Oral Oral Oral  SpO2: 100% 100%  100%  Weight:      Height:       Physical Exam  Constitutional: In no distress.  Cardiovascular: Normal rate, regular rhythm. No lower extremity edema  Pulmonary: Non labored breathing on room air, no wheezing or rales.   Abdominal: Soft. Non distended and non tender Musculoskeletal: s/p L bka     R TMA  Neurological: Alert and oriented to person, place, and time. Non focal  Skin: Skin is warm and dry.   Data Reviewed:     Latest Ref Rng & Units 01/08/2024   10:18 AM 01/07/2024   12:16 PM 01/07/2024   11:30 AM  CBC  WBC 4.0 - 10.5 K/uL 7.6     Hemoglobin 12.0 - 15.0 g/dL 9.2  6.4  8.2   Hematocrit 36.0 - 46.0 % 29.6  20.3  24.0   Platelets 150 - 400 K/uL 206         Latest Ref Rng & Units 01/08/2024   10:18 AM 01/07/2024   11:30  AM 01/07/2024   11:13 AM  BMP  Glucose 70 - 99 mg/dL 92   872   BUN 8 - 23 mg/dL 19   25   Creatinine 9.55 - 1.00 mg/dL 8.60   8.19   Sodium 864 - 145 mmol/L 140  142  139   Potassium 3.5 - 5.1 mmol/L 4.3  4.2  4.3   Chloride 98 - 111 mmol/L 108   105   CO2 22 - 32 mmol/L 21   24   Calcium  8.9 - 10.3 mg/dL 9.0   9.0       Family Communication: daughter  Disposition: Status is: Observation The patient will require care spanning > 2 midnights and should be moved to inpatient because: Pending GI evaluation,  Planned Discharge Destination: Facility     Time spent: 35 minutes  Author: Alban Pepper, MD 01/08/2024 7:22 PM  For on call review [email or web address].

## 2024-01-08 NOTE — Care Management Obs Status (Signed)
 MEDICARE OBSERVATION STATUS NOTIFICATION   Patient Details  Name: TEALE GOODGAME MRN: [id number] Date of Birth: 1960-04-04   Medicare Observation Status Notification Given:  Yes    Sonda Manuella Quill, RN 01/08/2024, 1:16 PM

## 2024-01-08 NOTE — Hospital Course (Addendum)
 This patient is a 63 yo F w/ h/o HTN, T2DM, PVD s/p L BKA and R TMA brought in for auditory hallucinations from SNF thought to be due to UTI.    She was found to have a hgb of 6.8 and 6.4  with a microcytosis and a ferritin of 15.   She denies any hematemesis or blood in her stool. She is supposed to be on antiplatelets but does not take these currently. She is not on any anticoagulation. She rarely takes NSAIDs for pain.  Per her caregiver and patient she has no other bleeding.   In 04/2020 she was evluated for melena in [place] .     Colonoscopy at that time revealed 2 polyps in the transverse colon measuring 4-5 mm in diameter that were resected. One 4 mm polyp in the descending colon was resected. Diverticulosis in the sigmoid colon. No evidence of bleeding in the colon.  EGD also unrevealing for source of bleeding.   She was to get a capsule endoscopy but unclear if she ever got that.    She is currently HDS and appropriately responded to transfusion of 2u of PRBCs.    **Interim history  GI was consulted and given her stability in hemoglobin after 2 unit transfusion they recommend outpatient EGD and colonoscopy.  Diet was advanced and she tolerated this well.  PT OT was consulted but they declined evaluation.  Medically stable for discharge  Assessment and Plan:  Blood loss anemia / Possibly GI source: Hgb responded to 2u prbcs. Has had GIB in the past with nl EGD/Colo in 2022. Scheduled to get capsule but never got this. Denies hematemesis/melena, hematochezia. No NSAID use. Stool guaic negative.  GI consulted and plan is for outpatient EGD and colon.  Hemoglobin/Hct stable as current trend is showing:  Recent Labs  Lab 01/07/24 1113 01/07/24 1130 01/07/24 1216 01/08/24 1018 01/09/24 0305 01/10/24 0933  HGB 6.8* 8.2* 6.4* 9.2* 9.5* 10.2*  HCT 21.7* 24.0* 20.3* 29.6* 30.2* 32.1*  MCV 64.8*  --   --  70.0* 69.6* 70.1*  - Repeat CBC within 1 week and follow-up with GI in  outpatient setting for  EGD and colon   Auditory Hallucinations: Resolved. Possibly in setting of infeciton.    Possible UTI: UA showed a hazy appearance with large leukocytes, negative nitrites, rare bacteria, 0-5 RBCs per high-power field, greater than 30 WBCs.  Seemed adequate antibiotic treatment for at least 3 days and actually got 4. Urine Cx showed <10,000 Insignificant Growth   AKI on CKD3a: BUN/Cr Trend: Recent Labs  Lab 01/07/24 1113 01/08/24 1018 01/09/24 0305 01/10/24 0933  BUN 25* 19 13 20   CREATININE 1.80* 1.39* 1.30* 1.46*  -Avoid Nephrotoxic Medications, Contrast Dyes, Hypotension and Dehydration to Ensure Adequate Renal Perfusion and will need to Renally Adjust Meds -Continue to Monitor and Trend Renal Function carefully and repeat CMP in the AM    DMT2: Will continue low dose long acting;  Recent Labs  Lab 01/08/24 2127 01/09/24 0724 01/09/24 1124 01/09/24 1714 01/09/24 2043 01/10/24 0725 01/10/24 1149  GLUCAP 113* 96 152* 128* 181* 108* 169*   Essential HTN  Poorly controlled. Patient not taking medications regularly. Resumed amlodipine . Will start ARB since patient not taking ACEI  - Continue monitor blood pressure per protocol and follow-up with PCP within 1 week   PVD status post left BKA: Resume antiplt and statin on discharge.   Hypoalbuminemia: Patient's Albumin Lvl went from 3.5 -> 3.3. CTM and Trend and repeat  CMP in the AM

## 2024-01-08 NOTE — Plan of Care (Signed)
 ?  Problem: Clinical Measurements: ?Goal: Will remain free from infection ?Outcome: Progressing ?  ?

## 2024-01-09 DIAGNOSIS — D62 Acute posthemorrhagic anemia: Secondary | ICD-10-CM | POA: Diagnosis not present

## 2024-01-09 LAB — CBC
HCT: 30.2 % — ABNORMAL LOW (ref 36.0–46.0)
Hemoglobin: 9.5 g/dL — ABNORMAL LOW (ref 12.0–15.0)
MCH: 21.9 pg — ABNORMAL LOW (ref 26.0–34.0)
MCHC: 31.5 g/dL (ref 30.0–36.0)
MCV: 69.6 fL — ABNORMAL LOW (ref 80.0–100.0)
Platelets: 278 K/uL (ref 150–400)
RBC: 4.34 MIL/uL (ref 3.87–5.11)
RDW: 22.4 % — ABNORMAL HIGH (ref 11.5–15.5)
WBC: 8.7 K/uL (ref 4.0–10.5)
nRBC: 0 % (ref 0.0–0.2)

## 2024-01-09 LAB — GLUCOSE, CAPILLARY
Glucose-Capillary: 82 mg/dL (ref 70–99)
Glucose-Capillary: 96 mg/dL (ref 70–99)
Glucose-Capillary: 152 mg/dL — ABNORMAL HIGH (ref 70–99)
Glucose-Capillary: 128 mg/dL — ABNORMAL HIGH (ref 70–99)
Glucose-Capillary: 181 mg/dL — ABNORMAL HIGH (ref 70–99)

## 2024-01-09 LAB — BASIC METABOLIC PANEL WITH GFR
Anion gap: 9 (ref 5–15)
BUN: 13 mg/dL (ref 8–23)
CO2: 24 mmol/L (ref 22–32)
Calcium: 9 mg/dL (ref 8.9–10.3)
Chloride: 106 mmol/L (ref 98–111)
Creatinine, Ser: 1.3 mg/dL — ABNORMAL HIGH (ref 0.44–1.00)
GFR, Estimated: 46 mL/min — ABNORMAL LOW (ref >60)
Glucose, Bld: 104 mg/dL — ABNORMAL HIGH (ref 70–99)
Potassium: 3.9 mmol/L (ref 3.5–5.1)
Sodium: 138 mmol/L (ref 135–145)

## 2024-01-09 LAB — VITAMIN B12: Vitamin B-12: 866 pg/mL (ref 180–914)

## 2024-01-09 LAB — FOLATE: Folate: 13.5 ng/mL (ref >5.9)

## 2024-01-09 LAB — MAGNESIUM: Magnesium: 2.1 mg/dL (ref 1.7–2.4)

## 2024-01-09 MED ORDER — LOSARTAN POTASSIUM 50 MG PO TABS
25.0000 mg | ORAL_TABLET | Freq: Every day | ORAL | Status: DC
  Administered 2024-01-09 – 2024-01-10 (×2): 25 mg via ORAL
  Filled 2024-01-09 (×2): qty 1

## 2024-01-09 NOTE — Plan of Care (Signed)
  Problem: Clinical Measurements: Goal: Ability to maintain clinical measurements within normal limits will improve Outcome: Progressing   Problem: Clinical Measurements: Goal: Will remain free from infection Outcome: Progressing   Problem: Nutrition: Goal: Adequate nutrition will be maintained Outcome: Progressing   

## 2024-01-09 NOTE — Consult Note (Signed)
 [facility] Consult  Referring Provider: No ref. provider found Primary Care Physician:  Pcp, No Primary Gastroenterologist: Sampson  Reason for Consultation: Anemia  SUBJECTIVE:   HPI: Brandi Hernandez is a 63 y.o. female with past medical history significant for diabetes mellitus, hypertension, DVT. Presented to hospital with chief complaint of altered mental status, attributed to UTI. Found to be anemic, GI consulted for evaluation/management.   EGD/COL 04/2020 [facility] [place]  with findings of normal esophagus, normal stomach, normal duodenum, transverse colon polyp x 2, descending colon polyp x 1, sigmoid diverticulosis.  Hgb 6.4 (now 9.5 after transfusion 2 units), baseline Hgb appears to be 8-9 from May 2024. Iron  studies wnl.   No family present at time of my evaluation. Patient poor historian. Denied any abdominal pain, nausea, vomiting, chest pain, shortness of breath.   Past Medical History:  Diagnosis Date   Deep vein thrombosis (DVT) (HCC)    Diabetes mellitus without complication (HCC)    Hypercholesterolemia    Hypertension    Memory deficit    Peripheral neuropathy    Renal disorder    Past Surgical History:  Procedure Laterality Date   LEG AMPUTATION Left    Below the knee   TOE AMPUTATION Right    Prior to Admission medications   Medication Sig Start Date End Date Taking? Authorizing Provider  amLODipine  (NORVASC ) 5 MG tablet Take 1 tablet (5 mg total) by mouth daily. Patient not taking: Reported on 01/08/2024 09/03/21 04/01/22  Gregg Lek, MD  atorvastatin  (LIPITOR) 40 MG tablet Take 1 tablet (40 mg total) by mouth daily. Patient not taking: Reported on 01/08/2024 09/03/21   Camara, Amadou, MD  buPROPion  (WELLBUTRIN  XL) 150 MG 24 hr tablet Take 1 tablet (150 mg total) by mouth daily. Patient not taking: Reported on 01/08/2024 09/03/21 04/01/22  Gregg Lek, MD  estradiol  (ESTRACE  VAGINAL) 0.1 MG/GM vaginal cream Place 1 Applicatorful  vaginally at bedtime. Patient not taking: Reported on 01/08/2024 08/08/22   Freddi Hamilton, MD  ferrous sulfate  325 (65 FE) MG tablet Take 1 tablet (325 mg total) by mouth daily. Patient not taking: Reported on 01/08/2024 08/08/22   Freddi Hamilton, MD  gabapentin  (NEURONTIN ) 100 MG capsule Take 1 capsule (100 mg total) by mouth 3 (three) times daily. Patient not taking: Reported on 01/08/2024 09/03/21 04/01/22  Camara, Amadou, MD  insulin isophane & regular human KwikPen (NOVOLIN  70/30 KWIKPEN) (70-30) 100 UNIT/ML KwikPen 20 in the am and 25 at night Patient not taking: Reported on 01/08/2024 09/03/21   Gregg Lek, MD  lisinopril  (ZESTRIL ) 10 MG tablet Take 1 tablet (10 mg total) by mouth daily. Patient not taking: Reported on 01/08/2024 09/03/21 04/01/22  Camara, Amadou, MD   Current Facility-Administered Medications  Medication Dose Route Frequency Provider Last Rate Last Admin   acetaminophen  (TYLENOL ) tablet 650 mg  650 mg Oral Q6H PRN Arthea Child, MD       Or   acetaminophen  (TYLENOL ) suppository 650 mg  650 mg Rectal Q6H PRN Arthea Child, MD       amLODipine  (NORVASC ) tablet 5 mg  5 mg Oral Daily Franchot Novel, MD   5 mg at 01/09/24 0934   bisacodyl (DULCOLAX) EC tablet 5 mg  5 mg Oral Daily PRN Claiborne, Claudia, MD       cefTRIAXone (ROCEPHIN) 1 g in sodium chloride  0.9 % 100 mL IVPB  1 g Intravenous Daily Claiborne, Claudia, MD   Stopped at 01/09/24 1007   insulin aspart (novoLOG) injection 0-6 Units  0-6 Units Subcutaneous TID WC Claiborne, Claudia, MD   1 Units at 01/09/24 1237   insulin glargine-yfgn [facility]) injection 5 Units  5 Units Subcutaneous BID Claiborne, Claudia, MD   5 Units at 01/09/24 0934   melatonin tablet 3 mg  3 mg Oral QHS PRN Arthea Child, MD       ondansetron (ZOFRAN) tablet 4 mg  4 mg Oral Q6H PRN Arthea Child, MD       Or   ondansetron (ZOFRAN) injection 4 mg  4 mg Intravenous Q6H PRN Arthea Child, MD       Allergies as of  01/07/2024   (No Known Allergies)   History reviewed. No pertinent family history. Social History   Socioeconomic History   Marital status: Single    Spouse name: Not on file   Number of children: Not on file   Years of education: Not on file   Highest education level: Not on file  Occupational History   Not on file  Tobacco Use   Smoking status: Never   Smokeless tobacco: Never  Vaping Use   Vaping status: Never Used  Substance and Sexual Activity   Alcohol use: Yes   Drug use: Never   Sexual activity: Not on file  Other Topics Concern   Not on file  Social History Narrative   Not on file   Social Drivers of Health   Financial Resource Strain: Not on file  Food Insecurity: No Food Insecurity (01/08/2024)   Hunger Vital Sign    Worried About Running Out of Food in the Last Year: Never true    Ran Out of Food in the Last Year: Never true  Transportation Needs: No Transportation Needs (01/08/2024)   PRAPARE - Administrator, Civil Service (Medical): No    Lack of Transportation (Non-Medical): No  Physical Activity: Not on file  Stress: Not on file  Social Connections: Not on file  Intimate Partner Violence: Not At Risk (01/08/2024)   Humiliation, Afraid, Rape, and Kick questionnaire    Fear of Current or Ex-Partner: No    Emotionally Abused: No    Physically Abused: No    Sexually Abused: No   Review of Systems:  Review of Systems  Respiratory:  Negative for shortness of breath.   Cardiovascular:  Negative for chest pain.  Gastrointestinal:  Negative for abdominal pain, nausea and vomiting.    OBJECTIVE:   Temp:  [97.7 F (36.5 C)-98.4 F (36.9 C)] 97.7 F (36.5 C) (10/20 1305) Pulse Rate:  [78-86] 78 (10/20 1305) Resp:  [14-16] 14 (10/20 1305) BP: (171-203)/(84-92) 199/84 (10/20 1305) SpO2:  [100 %] 100 % (10/20 1305) Last BM Date : 01/07/24 Physical Exam Constitutional:      General: She is not in acute distress.    Appearance: She is  not ill-appearing, toxic-appearing or diaphoretic.  Cardiovascular:     Rate and Rhythm: Normal rate and regular rhythm.  Pulmonary:     Effort: No respiratory distress.     Breath sounds: Normal breath sounds.  Abdominal:     General: Bowel sounds are normal. There is no distension.     Palpations: Abdomen is soft.     Tenderness: There is no abdominal tenderness. There is no guarding.  Neurological:     Mental Status: She is alert.     Labs: Recent Labs    01/07/24 1113 01/07/24 1130 01/07/24 1216 01/08/24 1018 01/09/24 0305  WBC 7.9  --   --  7.6  8.7  HGB 6.8*   < > 6.4* 9.2* 9.5*  HCT 21.7*   < > 20.3* 29.6* 30.2*  PLT 283  --   --  206 278   < > = values in this interval not displayed.   BMET Recent Labs    01/07/24 1113 01/07/24 1130 01/08/24 1018 01/09/24 0305  NA 139 142 140 138  K 4.3 4.2 4.3 3.9  CL 105  --  108 106  CO2 24  --  21* 24  GLUCOSE 127*  --  92 104*  BUN 25*  --  19 13  CREATININE 1.80*  --  1.39* 1.30*  CALCIUM  9.0  --  9.0 9.0   LFT Recent Labs    01/07/24 1113  PROT 8.3*  ALBUMIN 3.5  AST 28  ALT 21  ALKPHOS 93  BILITOT 0.5   PT/INR No results for input(s): LABPROT, INR in the last 72 hours.  Diagnostic imaging: No results found.  IMPRESSION: Normocytic anemia, acute on chronic Altered mental status Personal history colon polyp Diverticulosis  PLAN: -Talked with patient's daughter, Grayce, over telephone earlier today, noted previous procedures in 2022, worsened current anemia, can consider EGD and colonoscopy during this hospitalization, though likely appropriate for outpatient endoscopy given FOBT (-), will plan to formalize plan with patient and her daughter at bedside tomorrow -Clear liquid diet tomorrow if planning to proceed with inpatient endoscopy -Trend H/H, monitor bowel movements  -[facility] will follow   LOS: 0 days   Estefana Keas, DO [facility]

## 2024-01-09 NOTE — Plan of Care (Signed)
  Problem: Clinical Measurements: Goal: Diagnostic test results will improve Outcome: Progressing   Problem: Coping: Goal: Level of anxiety will decrease Outcome: Progressing   Problem: Safety: Goal: Ability to remain free from injury will improve Outcome: Progressing   Problem: Skin Integrity: Goal: Risk for impaired skin integrity will decrease Outcome: Progressing

## 2024-01-09 NOTE — Progress Notes (Signed)
 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    She was found to have a hgb of 6.8 and 6.4  with a microcytosis and a ferritin of 15.   She denies any hematemesis or blood in her stool. She is supposed to be on antiplatelets but does not take these currently. She is not on any anticoagulation. She rarely takes NSAIDs for pain.  Per her caregiver and patient she has no other bleeding.   In 04/2020 she was evluated for melena in [place] .     Colonoscopy at that time revealed 2 polyps in the transverse colon measuring 4-5 mm in diameter that were resected. One 4 mm polyp in the descending colon was resected. Diverticulosis in the sigmoid colon. No evidence of bleeding in the colon.  EGD also unrevealing for source of bleeding.   She was to get a capsule endoscopy but unclear if she ever got that.    She is currently HDS and appropriately responded to transfusion of 2u of PRBCs.      Assessment and Plan: Blood loss anemia  Possibly GI source  Unclear tempo.   Hgb responded to 2u prbcs. Has had GIB in the past with nl EGD/Colo in 2022. Scheduled to get capsule but never got this.   Denies hematemesis/melena, hematochezia. No NSAID use.  Stool guaic negative.  GI consulted.   Auditory hallucinations Resolved.  Possibly in setting of infeciton.   Possible UTI Continue  antibiotics  AKI on CKD3a Scr back at baseline.  Will resume ARB   Unclear if T1DM or T2DM Will continue low dose long acting CBG (last 3)  Recent Labs    01/09/24 0724 01/09/24 1124 01/09/24 1714  GLUCAP 96 152* 128*     HTN  Poorly controlled. Patient not taking medications regularly. Resumed amlodipine . Will start ARB since  patient not taking ACEI    PVD  Resume antiplt and statin on discharge.       Subjective: No issues overnight. Physical Exam: Vitals:   01/08/24 2132 01/08/24 2305 01/09/24 0438 01/09/24 1305  BP: (!) 203/88 (!) 184/92 (!) 171/89 (!) 199/84  Pulse: 81 80 86 78  Resp: 15 16 14 14   Temp: 98.4 F (36.9 C)  98.2 F (36.8 C) 97.7 F (36.5 C)  TempSrc: Oral  Oral Oral  SpO2:   100% 100%  Weight:      Height:        Constitutional: In no distress.  Cardiovascular: Normal rate, regular rhythm. No lower extremity edema  Pulmonary: Non labored breathing on room air, no wheezing or rales.   Abdominal: Soft. Non distended and non tender Musculoskeletal: s/p L bka, s/p R TMA  Neurological: Alert and oriented to person, place, and time. Non focal  Skin: Skin is warm and dry.   Data Reviewed:     Latest Ref Rng & Units 01/09/2024    3:05 AM 01/08/2024   10:18 AM 01/07/2024   12:16 PM  CBC  WBC 4.0 - 10.5 K/uL 8.7  7.6    Hemoglobin 12.0 - 15.0 g/dL 9.5  9.2  6.4   Hematocrit 36.0 - 46.0 % 30.2  29.6  20.3  Platelets 150 - 400 K/uL 278  206        Latest Ref Rng & Units 01/09/2024    3:05 AM 01/08/2024   10:18 AM 01/07/2024   11:30 AM  BMP  Glucose 70 - 99 mg/dL 895  92    BUN 8 - 23 mg/dL 13  19    Creatinine 9.55 - 1.00 mg/dL 8.69  8.60    Sodium 864 - 145 mmol/L 138  140  142   Potassium 3.5 - 5.1 mmol/L 3.9  4.3  4.2   Chloride 98 - 111 mmol/L 106  108    CO2 22 - 32 mmol/L 24  21    Calcium  8.9 - 10.3 mg/dL 9.0  9.0        Family Communication: daughter  Disposition: Status is: Observation The patient will require care spanning > 2 midnights and should be moved to inpatient because: Pending GI evaluation po  Planned Discharge Destination: Facility     Time spent: 35 minutes  Author: Alban Pepper, MD 01/09/2024 6:22 PM  For on call review [email or web address].

## 2024-01-10 ENCOUNTER — Other Ambulatory Visit (HOSPITAL_COMMUNITY): Payer: Self-pay

## 2024-01-10 DIAGNOSIS — G9340 Encephalopathy, unspecified: Secondary | ICD-10-CM | POA: Diagnosis not present

## 2024-01-10 DIAGNOSIS — D649 Anemia, unspecified: Secondary | ICD-10-CM | POA: Diagnosis not present

## 2024-01-10 DIAGNOSIS — E1059 Type 1 diabetes mellitus with other circulatory complications: Secondary | ICD-10-CM | POA: Diagnosis not present

## 2024-01-10 DIAGNOSIS — K922 Gastrointestinal hemorrhage, unspecified: Secondary | ICD-10-CM | POA: Diagnosis not present

## 2024-01-10 LAB — COMPREHENSIVE METABOLIC PANEL WITH GFR
ALT: 35 U/L (ref 0–44)
AST: 33 U/L (ref 15–41)
Albumin: 3.3 g/dL — ABNORMAL LOW (ref 3.5–5.0)
Alkaline Phosphatase: 98 U/L (ref 38–126)
Anion gap: 10 (ref 5–15)
BUN: 20 mg/dL (ref 8–23)
CO2: 24 mmol/L (ref 22–32)
Calcium: 8.6 mg/dL — ABNORMAL LOW (ref 8.9–10.3)
Chloride: 104 mmol/L (ref 98–111)
Creatinine, Ser: 1.46 mg/dL — ABNORMAL HIGH (ref 0.44–1.00)
GFR, Estimated: 40 mL/min — ABNORMAL LOW (ref >60)
Glucose, Bld: 138 mg/dL — ABNORMAL HIGH (ref 70–99)
Potassium: 4.4 mmol/L (ref 3.5–5.1)
Sodium: 138 mmol/L (ref 135–145)
Total Bilirubin: 0.3 mg/dL (ref 0.0–1.2)
Total Protein: 7.9 g/dL (ref 6.5–8.1)

## 2024-01-10 LAB — CBC WITH DIFFERENTIAL/PLATELET
Abs Immature Granulocytes: 0.02 K/uL (ref 0.00–0.07)
Basophils Absolute: 0.1 K/uL (ref 0.0–0.1)
Basophils Relative: 1 %
Eosinophils Absolute: 0.4 K/uL (ref 0.0–0.5)
Eosinophils Relative: 4 %
HCT: 32.1 % — ABNORMAL LOW (ref 36.0–46.0)
Hemoglobin: 10.2 g/dL — ABNORMAL LOW (ref 12.0–15.0)
Immature Granulocytes: 0 %
Lymphocytes Relative: 18 %
Lymphs Abs: 1.6 K/uL (ref 0.7–4.0)
MCH: 22.3 pg — ABNORMAL LOW (ref 26.0–34.0)
MCHC: 31.8 g/dL (ref 30.0–36.0)
MCV: 70.1 fL — ABNORMAL LOW (ref 80.0–100.0)
Monocytes Absolute: 0.9 K/uL (ref 0.1–1.0)
Monocytes Relative: 10 %
Neutro Abs: 6.2 K/uL (ref 1.7–7.7)
Neutrophils Relative %: 67 %
Platelets: 295 K/uL (ref 150–400)
RBC: 4.58 MIL/uL (ref 3.87–5.11)
RDW: 22.9 % — ABNORMAL HIGH (ref 11.5–15.5)
Smear Review: NORMAL
WBC: 9.1 K/uL (ref 4.0–10.5)
nRBC: 0 % (ref 0.0–0.2)

## 2024-01-10 LAB — GLUCOSE, CAPILLARY
Glucose-Capillary: 108 mg/dL — ABNORMAL HIGH (ref 70–99)
Glucose-Capillary: 169 mg/dL — ABNORMAL HIGH (ref 70–99)

## 2024-01-10 LAB — TYPE AND SCREEN
ABO/RH(D): AB POS
Antibody Screen: POSITIVE
DAT, IgG: NEGATIVE
Donor AG Type: NEGATIVE
Donor AG Type: NEGATIVE
PT AG Type: NEGATIVE
Unit division: 0
Unit division: 0

## 2024-01-10 LAB — BPAM RBC
Blood Product Expiration Date: 202511022359
Blood Product Expiration Date: 202511022359
ISSUE DATE / TIME: 202510190131
ISSUE DATE / TIME: 202510190516
Unit Type and Rh: 5100
Unit Type and Rh: 5100

## 2024-01-10 LAB — MAGNESIUM: Magnesium: 2.2 mg/dL (ref 1.7–2.4)

## 2024-01-10 LAB — PHOSPHORUS: Phosphorus: 3.5 mg/dL (ref 2.5–4.6)

## 2024-01-10 MED ORDER — ACETAMINOPHEN 325 MG PO TABS
650.0000 mg | ORAL_TABLET | Freq: Four times a day (QID) | ORAL | 0 refills | Status: AC | PRN
  Filled 2024-01-10: qty 20, 3d supply, fill #0

## 2024-01-10 MED ORDER — ONDANSETRON HCL 4 MG PO TABS
4.0000 mg | ORAL_TABLET | Freq: Four times a day (QID) | ORAL | 0 refills | Status: AC | PRN
  Filled 2024-01-10: qty 20, 5d supply, fill #0

## 2024-01-10 MED ORDER — HYDRALAZINE HCL 20 MG/ML IJ SOLN
10.0000 mg | Freq: Four times a day (QID) | INTRAMUSCULAR | Status: DC | PRN
  Administered 2024-01-10: 10 mg via INTRAVENOUS
  Filled 2024-01-10: qty 1

## 2024-01-10 MED ORDER — LOSARTAN POTASSIUM 25 MG PO TABS
25.0000 mg | ORAL_TABLET | Freq: Every day | ORAL | 0 refills | Status: AC
  Filled 2024-01-10: qty 30, 30d supply, fill #0

## 2024-01-10 NOTE — Discharge Summary (Signed)
 Physician Discharge Summary   Patient: Brandi Hernandez MRN: [id number] DOB: 05/11/1960  Admit date:     01/07/2024  Discharge date: 01/10/24  Discharge Physician: Alejandro Marker, DO   PCP: Pcp, No   Recommendations at discharge:   Follow-up with PCP within 1 to 2 weeks repeat CBC, CMP, mag, Phos within 1 week Follow-up with gastroenterology in the outpatient setting within 1 to 2 weeks and have outpatient EGD and colonoscopy  Discharge Diagnoses: Principal Problem:   Acute encephalopathy Active Problems:   Memory deficit   Deep vein thrombosis (DVT) (HCC)   UTI (urinary tract infection)   Symptomatic anemia   DM (diabetes mellitus) (HCC)   GI bleed  Resolved Problems:   * No resolved hospital problems. [facility] Course: This patient is a 63 yo F w/ h/o HTN, T2DM, PVD s/p L BKA and R TMA brought in for auditory hallucinations from SNF thought to be due to UTI.    She was found to have a hgb of 6.8 and 6.4  with a microcytosis and a ferritin of 15.   She denies any hematemesis or blood in her stool. She is supposed to be on antiplatelets but does not take these currently. She is not on any anticoagulation. She rarely takes NSAIDs for pain.  Per her caregiver and patient she has no other bleeding.   In 04/2020 she was evluated for melena in [place] .     Colonoscopy at that time revealed 2 polyps in the transverse colon measuring 4-5 mm in diameter that were resected. One 4 mm polyp in the descending colon was resected. Diverticulosis in the sigmoid colon. No evidence of bleeding in the colon.  EGD also unrevealing for source of bleeding.   She was to get a capsule endoscopy but unclear if she ever got that.    She is currently HDS and appropriately responded to transfusion of 2u of PRBCs.    **Interim history  GI was consulted and given her stability in hemoglobin after 2 unit transfusion they recommend outpatient EGD and colonoscopy.  Diet was advanced and she  tolerated this well.  PT OT was consulted but they declined evaluation.  Medically stable for discharge  Assessment and Plan:  Blood loss anemia / Possibly GI source: Hgb responded to 2u prbcs. Has had GIB in the past with nl EGD/Colo in 2022. Scheduled to get capsule but never got this. Denies hematemesis/melena, hematochezia. No NSAID use. Stool guaic negative.  GI consulted and plan is for outpatient EGD and colon.  Hemoglobin/Hct stable as current trend is showing:  Recent Labs  Lab 01/07/24 1113 01/07/24 1130 01/07/24 1216 01/08/24 1018 01/09/24 0305 01/10/24 0933  HGB 6.8* 8.2* 6.4* 9.2* 9.5* 10.2*  HCT 21.7* 24.0* 20.3* 29.6* 30.2* 32.1*  MCV 64.8*  --   --  70.0* 69.6* 70.1*  - Repeat CBC within 1 week and follow-up with GI in outpatient setting for  EGD and colon   Auditory Hallucinations: Resolved. Possibly in setting of infeciton.    Possible UTI: UA showed a hazy appearance with large leukocytes, negative nitrites, rare bacteria, 0-5 RBCs per high-power field, greater than 30 WBCs.  Seemed adequate antibiotic treatment for at least 3 days and actually got 4. Urine Cx showed <10,000 Insignificant Growth   AKI on CKD3a: BUN/Cr Trend: Recent Labs  Lab 01/07/24 1113 01/08/24 1018 01/09/24 0305 01/10/24 0933  BUN 25* 19 13 20   CREATININE 1.80* 1.39* 1.30* 1.46*  -Avoid Nephrotoxic Medications, Contrast Dyes,  Hypotension and Dehydration to Ensure Adequate Renal Perfusion and will need to Renally Adjust Meds -Continue to Monitor and Trend Renal Function carefully and repeat CMP in the AM    DMT2: Will continue low dose long acting;  Recent Labs  Lab 01/08/24 2127 01/09/24 0724 01/09/24 1124 01/09/24 1714 01/09/24 2043 01/10/24 0725 01/10/24 1149  GLUCAP 113* 96 152* 128* 181* 108* 169*   Essential HTN  Poorly controlled. Patient not taking medications regularly. Resumed amlodipine . Will start ARB since patient not taking ACEI  - Continue monitor blood pressure  per protocol and follow-up with PCP within 1 week   PVD status post left BKA: Resume antiplt and statin on discharge.   Hypoalbuminemia: Patient's Albumin Lvl went from 3.5 -> 3.3. CTM and Trend and repeat CMP in the AM  Consultants: Gastroenterology Procedures performed: As delineated as above   Disposition: Home Diet recommendation:  Discharge Diet Orders (From admission, onward)     Start     Ordered   01/10/24 0000  Diet - low sodium heart healthy        01/10/24 1624   01/10/24 0000  Diet Carb Modified        01/10/24 1624           Cardiac and Carb modified diet DISCHARGE MEDICATION: Allergies as of 01/10/2024   No Known Allergies      Medication List     STOP taking these medications    estradiol  0.1 MG/GM vaginal cream Commonly known as: ESTRACE  VAGINAL   lisinopril  10 MG tablet Commonly known as: ZESTRIL        TAKE these medications    acetaminophen  325 MG tablet Commonly known as: TYLENOL  Take 2 tablets (650 mg total) by mouth every 6 (six) hours as needed for mild pain (pain score 1-3) or fever (or Fever >/= 101).   amLODipine  5 MG tablet Commonly known as: NORVASC  Take 1 tablet (5 mg total) by mouth daily.   atorvastatin  40 MG tablet Commonly known as: Lipitor Take 1 tablet (40 mg total) by mouth daily.   buPROPion  150 MG 24 hr tablet Commonly known as: Wellbutrin  XL Take 1 tablet (150 mg total) by mouth daily.   ferrous sulfate  325 (65 FE) MG tablet Take 1 tablet (325 mg total) by mouth daily.   gabapentin  100 MG capsule Commonly known as: Neurontin  Take 1 capsule (100 mg total) by mouth 3 (three) times daily.   losartan 25 MG tablet Commonly known as: COZAAR Take 1 tablet (25 mg total) by mouth daily. Start taking on: January 11, 2024   NovoLIN  70/30 Kwikpen (70-30) 100 UNIT/ML KwikPen Generic drug: insulin isophane & regular human KwikPen 20 in the am and 25 at night   ondansetron 4 MG tablet Commonly known as:  ZOFRAN Take 1 tablet (4 mg total) by mouth every 6 (six) hours as needed for nausea.        Follow-up Information     Kriss Estefana DEL, DO. Call in 1 week(s).   Specialty: Gastroenterology Why: Call within the next week for follow up with GI to schedule an endoscopy and a colonoscopy Contact information: [address] [place] [address] [phone]                Discharge Exam: Brandi Hernandez   01/07/24 1535  Weight: 71.8 kg   Vitals:   01/10/24 1225 01/10/24 1230  BP: (!) 188/79 (!) 188/79  Pulse: 71   Resp:  Temp:    SpO2: 100%    Examination: Physical Exam:  Constitutional: Chronically ill-appearing African-American female in no acute distress Respiratory: Diminished to auscultation bilaterally, no wheezing, rales, rhonchi or crackles. Normal respiratory effort and patient is not tachypenic. No accessory muscle use.  Unlabored breathing Cardiovascular: RRR, no murmurs / rubs / gallops. S1 and S2 auscultated. No extremity edema. Abdomen: Soft, non-tender, non-distended. Bowel sounds positive.  GU: Deferred. Musculoskeletal: No clubbing / cyanosis of digits/nails.  Has a left BKA and a right TMA Skin: No rashes, lesions, ulcers on limited skin evaluation. No induration; Warm and dry.  Neurologic: CN 2-12 grossly intact with no focal deficits. Romberg sign and cerebellar reflexes not assessed.  Psychiatric: Normal judgment and insight. Alert and oriented x 3. Normal mood and appropriate affect.   Condition at discharge: stable  The results of significant diagnostics from this hospitalization (including imaging, microbiology, ancillary and laboratory) are listed below for reference.   Imaging Studies: CT HEAD WO CONTRAST Result Date: 01/07/2024 CLINICAL DATA:  Mental status change. EXAM: CT HEAD WITHOUT CONTRAST TECHNIQUE: Contiguous axial images were obtained from the base of the skull through the vertex without intravenous contrast.  RADIATION DOSE REDUCTION: This exam was performed according to the departmental dose-optimization program which includes automated exposure control, adjustment of the mA and/or kV according to patient size and/or use of iterative reconstruction technique. COMPARISON:  03/22/2021 FINDINGS: Brain: Ventricles, cisterns and other CSF spaces are normal. No mass, mass effect, shift of midline structures or acute hemorrhage. No evidence of acute infarction. Vascular: No hyperdense vessel or unexpected calcification. Skull: Normal. Negative for fracture or focal lesion. Sinuses/Orbits: Orbits are normal. Paranasal sinuses are well developed and otherwise clear. Other: None. IMPRESSION: No acute findings. Electronically Signed   By: Toribio Agreste M.D.   On: 01/07/2024 11:52   DG Chest Port 1 View Result Date: 01/07/2024 CLINICAL DATA:  Altered mental status. EXAM: PORTABLE CHEST 1 VIEW COMPARISON:  08/08/2022 FINDINGS: The lungs are clear without focal pneumonia, edema, pneumothorax or pleural effusion. The cardiopericardial silhouette is within normal limits for size. No acute bony abnormality. Telemetry leads overlie the chest. IMPRESSION: No active disease. Electronically Signed   By: Camellia Candle M.D.   On: 01/07/2024 11:41   Microbiology: Results for orders placed or performed during the hospital encounter of 01/07/24  Culture, blood (Routine X 2) w Reflex to ID Panel     Status: None (Preliminary result)   Collection Time: 01/07/24 11:13 AM   Specimen: BLOOD RIGHT FOREARM  Result Value Ref Range Status   Specimen Description   Final    BLOOD RIGHT FOREARM Performed at [facility], [address] [place], [place] [address]    Special Requests   Final    BOTTLES DRAWN AEROBIC AND ANAEROBIC Blood Culture adequate volume Performed at [facility], [address] [place], [place] [address]    Culture   Final    NO GROWTH 3 DAYS Performed at [facility], [address] [place], [place] [address]    Report Status PENDING  Incomplete  Culture, blood (Routine X 2) w Reflex to ID Panel     Status: None (Preliminary result)   Collection Time: 01/07/24 11:13 AM   Specimen: BLOOD  Result Value Ref Range Status   Specimen Description   Final    BLOOD LEFT ANTECUBITAL Performed at [facility], [address], [place], [place] [address]    Special Requests  Final    BOTTLES DRAWN AEROBIC AND ANAEROBIC Blood Culture results may not be optimal due to an inadequate volume of blood received in culture bottles Performed at [facility], [address] [place], [place] [address]    Culture   Final    NO GROWTH 3 DAYS Performed at [facility], [address], [place], [place] [address]    Report Status PENDING  Incomplete  Urine Culture (for pregnant, neutropenic or urologic patients or patients with an indwelling urinary catheter)     Status: Abnormal   Collection Time: 01/07/24  5:58 PM   Specimen: Urine, Clean Catch  Result Value Ref Range Status   Specimen Description   Final    URINE, CLEAN CATCH Performed at [facility], [address], [place], [place] [address]    Special Requests   Final    NONE Performed at [facility], [address] [place], [place] [address]    Culture (A)  Final    <10,000 COLONIES/mL INSIGNIFICANT GROWTH Performed at [facility], [address], [place], [place] [address]    Report Status 01/08/2024 FINAL  Final   Labs: CBC: Recent Labs  Lab 01/07/24 1113 01/07/24 1130 01/07/24 1216 01/08/24 1018 01/09/24 0305 01/10/24 0933  WBC 7.9  --   --  7.6 8.7 9.1  NEUTROABS 4.4  --   --   --   --  6.2  HGB 6.8* 8.2* 6.4* 9.2* 9.5* 10.2*  HCT 21.7* 24.0* 20.3* 29.6* 30.2* 32.1*  MCV 64.8*  --   --  70.0* 69.6* 70.1*  PLT 283  --   --  206 278 295   Basic Metabolic Panel: Recent Labs  Lab  01/07/24 1113 01/07/24 1130 01/08/24 1018 01/09/24 0305 01/10/24 0933  NA 139 142 140 138 138  K 4.3 4.2 4.3 3.9 4.4  CL 105  --  108 106 104  CO2 24  --  21* 24 24  GLUCOSE 127*  --  92 104* 138*  BUN 25*  --  19 13 20   CREATININE 1.80*  --  1.39* 1.30* 1.46*  CALCIUM  9.0  --  9.0 9.0 8.6*  MG  --   --  2.3 2.1 2.2  PHOS  --   --   --   --  3.5   Liver Function Tests: Recent Labs  Lab 01/07/24 1113 01/10/24 0933  AST 28 33  ALT 21 35  ALKPHOS 93 98  BILITOT 0.5 0.3  PROT 8.3* 7.9  ALBUMIN 3.5 3.3*   CBG: Recent Labs  Lab 01/09/24 1124 01/09/24 1714 01/09/24 2043 01/10/24 0725 01/10/24 1149  GLUCAP 152* 128* 181* 108* 169*   Discharge time spent: greater than 30 minutes.  Signed: Alejandro Marker, DO [facility] 01/10/2024

## 2024-01-10 NOTE — Progress Notes (Signed)
 Discharge meds in a secure bag delivered top patient in room by this RN. PIV removed by primary RN. Tele box returned to main desk. Patient waiting on a Depends so she can dress. Ride enroute with Depends/pull up. AVS reviewed w/ patient and caregiver in room. GI information added to AVS.

## 2024-01-10 NOTE — Progress Notes (Signed)
 PT Cancellation Note  Patient Details Name: Brandi Hernandez MRN: [id number] DOB: April 16, 1960   Cancelled Treatment:    Reason Eval/Treat Not Completed: Other (comment). Per daughter at bedside, pt hasn't walked in a couple of years, pt's daughter and granddaughter assist pt with everything at home, pt has exercises that she performs when desired. Daughter reiterates that is is from home and not a LTC facility, she is unsure how the ED got this mixed up. Pt using manual w/c or faimly pushing her in w/c, assisting her with front house steps in w/c. Daughter and pt politely decline PT/OT evaluations; notified OT.   Metta Ave PT, DPT 01/10/24, 2:53 PM

## 2024-01-10 NOTE — Progress Notes (Signed)
 [facility] Progress Note  SUBJECTIVE:   Interval history: Brandi Hernandez was seen and evaluated today at bedside. Resting comfortably in bed, on clear liquids until discussion with daughter/patient about endoscopy plan. No nausea, vomiting, chest pain, shortness of breath. Noted that Brandi Hernandez had bowel movement today.   Past Medical History:  Diagnosis Date   Deep vein thrombosis (DVT) (HCC)    Diabetes mellitus without complication (HCC)    Hypercholesterolemia    Hypertension    Memory deficit    Peripheral neuropathy    Renal disorder    Past Surgical History:  Procedure Laterality Date   LEG AMPUTATION Left    Below the knee   TOE AMPUTATION Right    Current Facility-Administered Medications  Medication Dose Route Frequency Provider Last Rate Last Admin   acetaminophen  (TYLENOL ) tablet 650 mg  650 mg Oral Q6H PRN Arthea Child, MD       Or   acetaminophen  (TYLENOL ) suppository 650 mg  650 mg Rectal Q6H PRN Arthea Child, MD       amLODipine  (NORVASC ) tablet 5 mg  5 mg Oral Daily Franchot Novel, MD   5 mg at 01/10/24 1024   bisacodyl (DULCOLAX) EC tablet 5 mg  5 mg Oral Daily PRN Arthea Child, MD       cefTRIAXone (ROCEPHIN) 1 g in sodium chloride  0.9 % 100 mL IVPB  1 g Intravenous Daily Claiborne, Claudia, MD 200 mL/hr at 01/10/24 1020 1 g at 01/10/24 1020   hydrALAZINE (APRESOLINE) injection 10 mg  10 mg Intravenous Q6H PRN Sheikh, Omair Latif, DO       insulin aspart (novoLOG) injection 0-6 Units  0-6 Units Subcutaneous TID WC Claiborne, Claudia, MD   1 Units at 01/10/24 1201   insulin glargine-yfgn (SEMGLEE) injection 5 Units  5 Units Subcutaneous BID Claiborne, Claudia, MD   5 Units at 01/10/24 1100   losartan (COZAAR) tablet 25 mg  25 mg Oral Daily Franchot Novel, MD   25 mg at 01/10/24 1021   melatonin tablet 3 mg  3 mg Oral QHS PRN Arthea Child, MD       ondansetron (ZOFRAN) tablet 4 mg  4 mg Oral Q6H PRN Arthea Child, MD       Or    ondansetron (ZOFRAN) injection 4 mg  4 mg Intravenous Q6H PRN Arthea Child, MD       Allergies as of 01/07/2024   (No Known Allergies)   Review of Systems:  Review of Systems  Respiratory:  Negative for shortness of breath.   Cardiovascular:  Negative for chest pain.  Gastrointestinal:  Negative for abdominal pain, nausea and vomiting.    OBJECTIVE:   Temp:  [97.7 F (36.5 C)-98.4 F (36.9 C)] 97.9 F (36.6 C) (10/21 1148) Pulse Rate:  [68-84] 71 (10/21 1225) Resp:  [14-18] 17 (10/21 1148) BP: (161-199)/(79-90) 188/79 (10/21 1225) SpO2:  [99 %-100 %] 100 % (10/21 1225) Last BM Date : 01/07/24 Physical Exam Constitutional:      General: Brandi Hernandez is not in acute distress.    Appearance: Brandi Hernandez is not ill-appearing, toxic-appearing or diaphoretic.  Cardiovascular:     Rate and Rhythm: Normal rate and regular rhythm.  Pulmonary:     Effort: No respiratory distress.     Breath sounds: Normal breath sounds.  Abdominal:     General: Bowel sounds are normal. There is no distension.     Palpations: Abdomen is soft.     Tenderness: There is no abdominal tenderness. There  is no guarding.  Skin:    General: Skin is warm and dry.  Neurological:     Mental Status: Brandi Hernandez is alert.     Labs: Recent Labs    01/08/24 1018 01/09/24 0305 01/10/24 0933  WBC 7.6 8.7 9.1  HGB 9.2* 9.5* 10.2*  HCT 29.6* 30.2* 32.1*  PLT 206 278 295   BMET Recent Labs    01/08/24 1018 01/09/24 0305 01/10/24 0933  NA 140 138 138  K 4.3 3.9 4.4  CL 108 106 104  CO2 21* 24 24  GLUCOSE 92 104* 138*  BUN 19 13 20   CREATININE 1.39* 1.30* 1.46*  CALCIUM  9.0 9.0 8.6*   LFT Recent Labs    01/10/24 0933  PROT 7.9  ALBUMIN 3.3*  AST 33  ALT 35  ALKPHOS 98  BILITOT 0.3   PT/INR No results for input(s): LABPROT, INR in the last 72 hours. Diagnostic imaging: No results found.  IMPRESSION: Normocytic anemia, acute on chronic Altered mental status, stable  Personal history colon  polyp Diverticulosis  PLAN: -Hgb stable, no documented episodes of bloody stools -Ok for outpatient EGD and colonoscopy, called and talked with patient's daughter, Grayce, over telephone, Brandi Hernandez is in agreement with this plan  -Ok for regular diet, recommend outpatient follow up with me to arrange EGD/COL   LOS: 0 days   Estefana Keas, [facility]

## 2024-01-10 NOTE — Plan of Care (Signed)
  Problem: Health Behavior/Discharge Planning: Goal: Ability to manage health-related needs will improve Outcome: Progressing   Problem: Activity: Goal: Risk for activity intolerance will decrease Outcome: Progressing   Problem: Nutrition: Goal: Adequate nutrition will be maintained Outcome: Progressing   

## 2024-01-10 NOTE — Progress Notes (Signed)
 OT Cancellation Note  Patient Details Name: Brandi Hernandez MRN: [id number] DOB: 11-28-1960   Cancelled Treatment:    Reason Eval/Treat Not Completed: OT screened, no needs identified, will sign off  OT screen, family declined OT needs for PT/OT and assist with all care. OT s/o. Thank you for this referral.   Geni OT/L Acute Rehabilitation Department  [phone]    01/10/2024, 3:01 PM

## 2024-01-12 LAB — CULTURE, BLOOD (ROUTINE X 2)
Culture: NO GROWTH
Culture: NO GROWTH
Special Requests: ADEQUATE
